# Patient Record
Sex: Male | Born: 1970 | Race: Black or African American | Hispanic: No | Smoking: Former smoker
Health system: Southern US, Community
[De-identification: ages and names within clinical notes are randomized; demographics above are authoritative.]

## PROBLEM LIST (undated history)

## (undated) DIAGNOSIS — F419 Anxiety disorder, unspecified: Secondary | ICD-10-CM

## (undated) DIAGNOSIS — E1165 Type 2 diabetes mellitus with hyperglycemia: Secondary | ICD-10-CM

## (undated) DIAGNOSIS — G5601 Carpal tunnel syndrome, right upper limb: Secondary | ICD-10-CM

## (undated) DIAGNOSIS — N529 Male erectile dysfunction, unspecified: Secondary | ICD-10-CM

## (undated) DIAGNOSIS — I519 Heart disease, unspecified: Secondary | ICD-10-CM

## (undated) DIAGNOSIS — E785 Hyperlipidemia, unspecified: Secondary | ICD-10-CM

## (undated) DIAGNOSIS — I10 Essential (primary) hypertension: Secondary | ICD-10-CM

## (undated) DIAGNOSIS — E039 Hypothyroidism, unspecified: Secondary | ICD-10-CM

## (undated) DIAGNOSIS — G4733 Obstructive sleep apnea (adult) (pediatric): Secondary | ICD-10-CM

## (undated) HISTORY — DX: Heart disease, unspecified: I51.9

## (undated) HISTORY — DX: Essential (primary) hypertension: I10

## (undated) HISTORY — DX: Type 2 diabetes mellitus with hyperglycemia: E11.65

## (undated) HISTORY — DX: Carpal tunnel syndrome, right upper limb: G56.01

## (undated) HISTORY — DX: Hypothyroidism, unspecified: E03.9

## (undated) HISTORY — DX: Obstructive sleep apnea (adult) (pediatric): G47.33

## (undated) HISTORY — DX: Anxiety disorder, unspecified: F41.9

## (undated) HISTORY — DX: Male erectile dysfunction, unspecified: N52.9

## (undated) HISTORY — DX: Hyperlipidemia, unspecified: E78.5

---

## 2013-11-06 ENCOUNTER — Emergency Department (HOSPITAL_COMMUNITY)
Admission: EM | Admit: 2013-11-06 | Discharge: 2013-11-06 | Disposition: A | Discharge status: Home / Self Care | Payer: Managed Care, Other (non HMO) | Attending: Emergency Medicine | Admitting: Emergency Medicine

## 2013-11-06 DIAGNOSIS — S46909A Unspecified injury of unspecified muscle, fascia and tendon at shoulder and upper arm level, unspecified arm, initial encounter: Secondary | ICD-10-CM | POA: Insufficient documentation

## 2013-11-06 DIAGNOSIS — Y990 Civilian activity done for income or pay: Secondary | ICD-10-CM | POA: Insufficient documentation

## 2013-11-06 DIAGNOSIS — S4980XA Other specified injuries of shoulder and upper arm, unspecified arm, initial encounter: Secondary | ICD-10-CM | POA: Insufficient documentation

## 2013-11-06 DIAGNOSIS — Y9389 Activity, other specified: Secondary | ICD-10-CM | POA: Insufficient documentation

## 2013-11-06 DIAGNOSIS — Y929 Unspecified place or not applicable: Secondary | ICD-10-CM | POA: Insufficient documentation

## 2013-11-06 DIAGNOSIS — M25511 Pain in right shoulder: Secondary | ICD-10-CM

## 2013-11-06 DIAGNOSIS — X503XXA Overexertion from repetitive movements, initial encounter: Secondary | ICD-10-CM | POA: Insufficient documentation

## 2013-11-06 MED ORDER — IBUPROFEN 800 MG PO TABS: 800.0000 mg | ORAL_TABLET | Freq: Three times a day (TID) | ORAL | Status: DC

## 2013-11-06 NOTE — ED Provider Notes (Signed)
CSN: [id number]     Arrival date & time 11/06/13  1520 History   First MD Initiated Contact with Patient 11/06/13 1530     Chief Complaint  Patient presents with  . Shoulder Pain   HPI Pt reports 3 days of ongoing right shoulder pain. Repetitive motion with right arm and shoulder at work. Felt a "pull" 3 days ago when lifting. No trauma. No swelling. No fevers. Denies rash or erythema of right shoulder. No CP or SOB. Pain is mild. Worse with abduction of right shoulder.    PMHX: none Family hx: non contributory SH: denies drug abuse    Review of Systems  All other systems reviewed and are negative.    Allergies  Review of patient's allergies indicates not on file.  Home Medications   Current Outpatient Rx  Name  Route  Sig  Dispense  Refill  . ibuprofen (ADVIL,MOTRIN) 800 MG tablet   Oral   Take 1 tablet (800 mg total) by mouth 3 (three) times daily.   21 tablet   0    BP 150/82  Pulse 87  Temp(Src) 98.7 F (37.1 C) (Oral)  Resp 16  SpO2 100% Physical Exam  Nursing note and vitals reviewed. Constitutional: He is oriented to person, place, and time. He appears well-developed and well-nourished.  HENT:  Head: Normocephalic.  Eyes: EOM are normal.  Neck: Normal range of motion.  Pulmonary/Chest: Effort normal.  Abdominal: He exhibits no distension.  Musculoskeletal: Normal range of motion.  Full ROM of right shoulder. Some impingement with abduction at approx 85 degrees. Normal right radial pulse. No tenderness over right AC joint  Neurological: He is alert and oriented to person, place, and time.  Psychiatric: He has a normal mood and affect.    ED Course  Procedures (including critical care time) Labs Review Labs Reviewed - No data to display Imaging Review No results found.  EKG Interpretation   None       MDM   1. Right shoulder pain    Ortho follow up. Normal pulses. Outpatient MRI may be required. No indication for plain film today given  lack of trauma    Lyanne Co, MD 11/06/13 [phone]

## 2013-11-06 NOTE — ED Notes (Signed)
Pt c/o R shoulder pain since Tuesday. Pt thinks that the repetitive motion at work is causing the pain. Pt c/o worsening pain with lifting shoulder. ROM intact, but painful.

## 2013-11-16 ENCOUNTER — Encounter (HOSPITAL_COMMUNITY): Payer: Self-pay | Admitting: Emergency Medicine

## 2013-11-16 DIAGNOSIS — Z5900 Homelessness unspecified: Secondary | ICD-10-CM | POA: Insufficient documentation

## 2013-11-16 DIAGNOSIS — I10 Essential (primary) hypertension: Secondary | ICD-10-CM | POA: Insufficient documentation

## 2013-11-16 DIAGNOSIS — Z91199 Patient's noncompliance with other medical treatment and regimen due to unspecified reason: Secondary | ICD-10-CM | POA: Insufficient documentation

## 2013-11-16 DIAGNOSIS — R350 Frequency of micturition: Secondary | ICD-10-CM | POA: Insufficient documentation

## 2013-11-16 DIAGNOSIS — Z9119 Patient's noncompliance with other medical treatment and regimen: Secondary | ICD-10-CM | POA: Insufficient documentation

## 2013-11-16 DIAGNOSIS — R5381 Other malaise: Secondary | ICD-10-CM | POA: Insufficient documentation

## 2013-11-16 DIAGNOSIS — Z79899 Other long term (current) drug therapy: Secondary | ICD-10-CM | POA: Insufficient documentation

## 2013-11-16 DIAGNOSIS — Z87891 Personal history of nicotine dependence: Secondary | ICD-10-CM | POA: Insufficient documentation

## 2013-11-16 DIAGNOSIS — Z794 Long term (current) use of insulin: Secondary | ICD-10-CM | POA: Insufficient documentation

## 2013-11-16 DIAGNOSIS — E119 Type 2 diabetes mellitus without complications: Secondary | ICD-10-CM | POA: Insufficient documentation

## 2013-11-16 DIAGNOSIS — R5383 Other fatigue: Secondary | ICD-10-CM | POA: Insufficient documentation

## 2013-11-16 DIAGNOSIS — R631 Polydipsia: Secondary | ICD-10-CM | POA: Insufficient documentation

## 2013-11-16 LAB — CBC
MCH: 30.3 pg (ref 26.0–34.0)
MCHC: 34.7 g/dL (ref 30.0–36.0)
MCV: 87.6 fL (ref 78.0–100.0)
Platelets: 270 10*3/uL (ref 150–400)
RDW: 12.9 % (ref 11.5–15.5)
WBC: 8.9 10*3/uL (ref 4.0–10.5)

## 2013-11-16 LAB — COMPREHENSIVE METABOLIC PANEL
AST: 32 U/L (ref 0–37)
Albumin: 3.8 g/dL (ref 3.5–5.2)
BUN: 29 mg/dL — ABNORMAL HIGH (ref 6–23)
CO2: 24 mEq/L (ref 19–32)
Calcium: 9 mg/dL (ref 8.4–10.5)
Creatinine, Ser: 1.31 mg/dL (ref 0.50–1.35)
GFR calc non Af Amer: 66 mL/min — ABNORMAL LOW (ref >90)
Glucose, Bld: 468 mg/dL — ABNORMAL HIGH (ref 70–99)
Potassium: 4.1 mEq/L (ref 3.5–5.1)
Total Protein: 7.6 g/dL (ref 6.0–8.3)

## 2013-11-16 LAB — GLUCOSE, CAPILLARY: Glucose-Capillary: 440 mg/dL — ABNORMAL HIGH (ref 70–99)

## 2013-11-16 NOTE — ED Notes (Signed)
Patient states he has had increased urination and not feeling right.  Dry mouth

## 2013-11-17 ENCOUNTER — Emergency Department (HOSPITAL_COMMUNITY)
Admission: EM | Admit: 2013-11-17 | Discharge: 2013-11-17 | Disposition: A | Discharge status: Home / Self Care | Payer: Managed Care, Other (non HMO) | Attending: Emergency Medicine | Admitting: Emergency Medicine

## 2013-11-17 DIAGNOSIS — E119 Type 2 diabetes mellitus without complications: Secondary | ICD-10-CM

## 2013-11-17 DIAGNOSIS — R739 Hyperglycemia, unspecified: Secondary | ICD-10-CM

## 2013-11-17 LAB — GLUCOSE, CAPILLARY
Glucose-Capillary: 326 mg/dL — ABNORMAL HIGH (ref 70–99)
Glucose-Capillary: 451 mg/dL — ABNORMAL HIGH (ref 70–99)

## 2013-11-17 MED ORDER — SODIUM CHLORIDE 0.9 % IV BOLUS (SEPSIS)
1000.0000 mL | Freq: Once | INTRAVENOUS | Status: AC
  Administered 2013-11-17: 1000 mL via INTRAVENOUS

## 2013-11-17 MED ORDER — INSULIN ASPART 100 UNIT/ML ~~LOC~~ SOLN
12.0000 [IU] | Freq: Once | SUBCUTANEOUS | Status: AC
  Administered 2013-11-17: 12 [IU] via INTRAVENOUS

## 2013-11-17 MED ORDER — INSULIN ASPART 100 UNIT/ML ~~LOC~~ SOLN
8.0000 [IU] | Freq: Once | SUBCUTANEOUS | Status: AC
  Administered 2013-11-17: 8 [IU] via SUBCUTANEOUS

## 2013-11-17 NOTE — ED Provider Notes (Signed)
CSN: [id number]     Arrival date & time 11/16/13  2055 History   First MD Initiated Contact with Patient 11/17/13 0147     Chief Complaint  Patient presents with  . Hyperglycemia   (Consider location/radiation/quality/duration/timing/severity/associated sxs/prior Treatment) HPI 42 yo male presents [facility] the ER from homeless shelter with complaint of generalized fatigue, increased thirst, urination over the last few days.  Pt has h/o diabetes, has not checked his sugar or taking medications for about 2 weeks.  Pt recently moved to [place] without his supplies.  He has plans to return to his hometown on [place] to go to his clinic for refills.  He denies chest pain, n/v/d, fever, chills. Past Medical History  Diagnosis Date  . Diabetes mellitus without complication   . Hypertension    History reviewed. No pertinent past surgical history. History reviewed. No pertinent family history. History  Substance Use Topics  . Smoking status: Former Smoker    Quit date: 10/26/2013  . Smokeless tobacco: Not on file  . Alcohol Use: Yes     Comment: ocassionally    Review of Systems  All other systems reviewed and are negative.    Allergies  Review of patient's allergies indicates no known allergies.  Home Medications   Current Outpatient Rx  Name  Route  Sig  Dispense  Refill  . insulin detemir (LEVEMIR) 100 UNIT/ML injection   Subcutaneous   Inject 30 Units into the skin 2 (two) times daily.         . insulin lispro (HUMALOG) 100 UNIT/ML injection   Subcutaneous   Inject 13 Units into the skin 2 (two) times daily.         . metFORMIN (GLUCOPHAGE) 1000 MG tablet   Oral   Take 1,000 mg by mouth 2 (two) times daily with a meal.          BP 134/61  Pulse 74  Temp(Src) 98.2 F (36.8 C) (Oral)  Resp 18  Ht 5\' 2"  (1.575 m)  Wt 201 lb (91.173 kg)  BMI 36.75 kg/m2  SpO2 96% Physical Exam  Nursing note and vitals reviewed. Constitutional: He is oriented to person,  place, and time. He appears well-developed and well-nourished. No distress.  HENT:  Head: Normocephalic and atraumatic.  Nose: Nose normal.  Dry mucous membranes  Eyes: Conjunctivae and EOM are normal. Pupils are equal, round, and reactive to light.  Neck: Normal range of motion. Neck supple. No JVD present. No tracheal deviation present. No thyromegaly present.  Cardiovascular: Normal rate, regular rhythm, normal heart sounds and intact distal pulses.  Exam reveals no gallop and no friction rub.   No murmur heard. Pulmonary/Chest: Effort normal and breath sounds normal. No stridor. No respiratory distress. He has no wheezes. He has no rales. He exhibits no tenderness.  Abdominal: Soft. Bowel sounds are normal. He exhibits no distension and no mass. There is no tenderness. There is no rebound and no guarding.  Musculoskeletal: Normal range of motion. He exhibits no edema and no tenderness.  Lymphadenopathy:    He has no cervical adenopathy.  Neurological: He is alert and oriented to person, place, and time. He exhibits normal muscle tone. Coordination normal.  Skin: Skin is warm and dry. No rash noted. No erythema. No pallor.  Psychiatric: He has a normal mood and affect. His behavior is normal. Judgment and thought content normal.    ED Course  Procedures (including critical care time) Labs Review Labs Reviewed  GLUCOSE, CAPILLARY -  Abnormal; Notable for the following:    Glucose-Capillary 440 (*)    All other components within normal limits  COMPREHENSIVE METABOLIC PANEL - Abnormal; Notable for the following:    Glucose, Bld 468 (*)    BUN 29 (*)    Total Bilirubin 0.1 (*)    GFR calc non Af Amer 66 (*)    GFR calc Af Amer 77 (*)    All other components within normal limits  GLUCOSE, CAPILLARY - Abnormal; Notable for the following:    Glucose-Capillary 451 (*)    All other components within normal limits  GLUCOSE, CAPILLARY - Abnormal; Notable for the following:     Glucose-Capillary 375 (*)    All other components within normal limits  GLUCOSE, CAPILLARY - Abnormal; Notable for the following:    Glucose-Capillary 326 (*)    All other components within normal limits  GLUCOSE, CAPILLARY - Abnormal; Notable for the following:    Glucose-Capillary 131 (*)    All other components within normal limits  CBC   Imaging Review No results found.  EKG Interpretation   None       MDM   1. Diabetes mellitus   2. Hyperglycemia    42 yo male noncompliant with his diabetes medications.  Hyperglycemia without ketosis here.  Glucose improved with iv fluids, insulin.  Pt has plan to get his medications tomorrow.    Olivia Mackie, MD 11/17/13 2009

## 2013-12-09 ENCOUNTER — Institutional Professional Consult (permissible substitution): Payer: Self-pay | Admitting: Pulmonary Disease

## 2014-01-07 ENCOUNTER — Institutional Professional Consult (permissible substitution): Payer: Managed Care, Other (non HMO) | Admitting: Pulmonary Disease

## 2014-02-03 ENCOUNTER — Institutional Professional Consult (permissible substitution): Payer: Managed Care, Other (non HMO) | Admitting: Pulmonary Disease

## 2016-01-17 DIAGNOSIS — R430 Anosmia: Secondary | ICD-10-CM | POA: Insufficient documentation

## 2016-07-11 DIAGNOSIS — J342 Deviated nasal septum: Secondary | ICD-10-CM | POA: Insufficient documentation

## 2017-10-14 DIAGNOSIS — F419 Anxiety disorder, unspecified: Secondary | ICD-10-CM | POA: Insufficient documentation

## 2017-10-14 DIAGNOSIS — E039 Hypothyroidism, unspecified: Secondary | ICD-10-CM | POA: Insufficient documentation

## 2017-10-14 DIAGNOSIS — E785 Hyperlipidemia, unspecified: Secondary | ICD-10-CM | POA: Insufficient documentation

## 2017-10-14 DIAGNOSIS — I10 Essential (primary) hypertension: Secondary | ICD-10-CM | POA: Insufficient documentation

## 2017-10-14 DIAGNOSIS — N529 Male erectile dysfunction, unspecified: Secondary | ICD-10-CM | POA: Insufficient documentation

## 2017-10-14 DIAGNOSIS — G5601 Carpal tunnel syndrome, right upper limb: Secondary | ICD-10-CM | POA: Insufficient documentation

## 2017-12-06 DIAGNOSIS — M25551 Pain in right hip: Secondary | ICD-10-CM | POA: Diagnosis not present

## 2017-12-06 DIAGNOSIS — E079 Disorder of thyroid, unspecified: Secondary | ICD-10-CM | POA: Diagnosis not present

## 2017-12-06 DIAGNOSIS — Y9239 Other specified sports and athletic area as the place of occurrence of the external cause: Secondary | ICD-10-CM | POA: Diagnosis not present

## 2017-12-06 DIAGNOSIS — Y93B3 Activity, free weights: Secondary | ICD-10-CM | POA: Diagnosis not present

## 2017-12-06 DIAGNOSIS — E119 Type 2 diabetes mellitus without complications: Secondary | ICD-10-CM | POA: Diagnosis not present

## 2017-12-06 DIAGNOSIS — X500XXA Overexertion from strenuous movement or load, initial encounter: Secondary | ICD-10-CM | POA: Diagnosis not present

## 2017-12-06 DIAGNOSIS — S39012A Strain of muscle, fascia and tendon of lower back, initial encounter: Secondary | ICD-10-CM | POA: Diagnosis not present

## 2017-12-06 DIAGNOSIS — I10 Essential (primary) hypertension: Secondary | ICD-10-CM | POA: Diagnosis not present

## 2017-12-06 DIAGNOSIS — Z87891 Personal history of nicotine dependence: Secondary | ICD-10-CM | POA: Diagnosis not present

## 2017-12-06 DIAGNOSIS — Z794 Long term (current) use of insulin: Secondary | ICD-10-CM | POA: Diagnosis not present

## 2017-12-06 DIAGNOSIS — M545 Low back pain: Secondary | ICD-10-CM | POA: Diagnosis not present

## 2017-12-06 DIAGNOSIS — Z79899 Other long term (current) drug therapy: Secondary | ICD-10-CM | POA: Diagnosis not present

## 2017-12-13 DIAGNOSIS — Z87891 Personal history of nicotine dependence: Secondary | ICD-10-CM | POA: Diagnosis not present

## 2017-12-13 DIAGNOSIS — M5441 Lumbago with sciatica, right side: Secondary | ICD-10-CM | POA: Diagnosis not present

## 2017-12-13 DIAGNOSIS — I10 Essential (primary) hypertension: Secondary | ICD-10-CM | POA: Diagnosis not present

## 2017-12-13 DIAGNOSIS — M549 Dorsalgia, unspecified: Secondary | ICD-10-CM | POA: Diagnosis not present

## 2017-12-13 DIAGNOSIS — R030 Elevated blood-pressure reading, without diagnosis of hypertension: Secondary | ICD-10-CM | POA: Diagnosis not present

## 2017-12-13 DIAGNOSIS — Z7984 Long term (current) use of oral hypoglycemic drugs: Secondary | ICD-10-CM | POA: Diagnosis not present

## 2017-12-13 DIAGNOSIS — M5136 Other intervertebral disc degeneration, lumbar region: Secondary | ICD-10-CM | POA: Diagnosis not present

## 2017-12-13 DIAGNOSIS — Z794 Long term (current) use of insulin: Secondary | ICD-10-CM | POA: Diagnosis not present

## 2017-12-13 DIAGNOSIS — Z79899 Other long term (current) drug therapy: Secondary | ICD-10-CM | POA: Diagnosis not present

## 2017-12-13 DIAGNOSIS — E118 Type 2 diabetes mellitus with unspecified complications: Secondary | ICD-10-CM | POA: Diagnosis not present

## 2017-12-13 DIAGNOSIS — M47896 Other spondylosis, lumbar region: Secondary | ICD-10-CM | POA: Diagnosis not present

## 2017-12-13 DIAGNOSIS — E079 Disorder of thyroid, unspecified: Secondary | ICD-10-CM | POA: Diagnosis not present

## 2018-01-23 DIAGNOSIS — E1165 Type 2 diabetes mellitus with hyperglycemia: Secondary | ICD-10-CM | POA: Diagnosis not present

## 2018-01-23 DIAGNOSIS — E039 Hypothyroidism, unspecified: Secondary | ICD-10-CM | POA: Diagnosis not present

## 2018-01-23 DIAGNOSIS — I519 Heart disease, unspecified: Secondary | ICD-10-CM | POA: Diagnosis not present

## 2018-01-23 DIAGNOSIS — I10 Essential (primary) hypertension: Secondary | ICD-10-CM | POA: Diagnosis not present

## 2018-01-23 LAB — HM HEPATITIS C SCREENING LAB: HM Hepatitis Screen: NEGATIVE

## 2018-02-06 DIAGNOSIS — M545 Low back pain: Secondary | ICD-10-CM | POA: Diagnosis not present

## 2018-02-06 DIAGNOSIS — M79604 Pain in right leg: Secondary | ICD-10-CM | POA: Diagnosis not present

## 2018-02-07 DIAGNOSIS — J0121 Acute recurrent ethmoidal sinusitis: Secondary | ICD-10-CM | POA: Diagnosis not present

## 2018-02-07 DIAGNOSIS — J339 Nasal polyp, unspecified: Secondary | ICD-10-CM | POA: Diagnosis not present

## 2018-02-07 DIAGNOSIS — R220 Localized swelling, mass and lump, head: Secondary | ICD-10-CM | POA: Diagnosis not present

## 2018-02-07 DIAGNOSIS — R430 Anosmia: Secondary | ICD-10-CM | POA: Diagnosis not present

## 2018-02-13 DIAGNOSIS — J3489 Other specified disorders of nose and nasal sinuses: Secondary | ICD-10-CM | POA: Diagnosis not present

## 2018-02-13 DIAGNOSIS — J339 Nasal polyp, unspecified: Secondary | ICD-10-CM | POA: Diagnosis not present

## 2018-02-13 DIAGNOSIS — J320 Chronic maxillary sinusitis: Secondary | ICD-10-CM | POA: Diagnosis not present

## 2018-02-13 DIAGNOSIS — J0121 Acute recurrent ethmoidal sinusitis: Secondary | ICD-10-CM | POA: Diagnosis not present

## 2018-02-13 DIAGNOSIS — R430 Anosmia: Secondary | ICD-10-CM | POA: Diagnosis not present

## 2018-02-14 DIAGNOSIS — Z7984 Long term (current) use of oral hypoglycemic drugs: Secondary | ICD-10-CM | POA: Diagnosis not present

## 2018-02-14 DIAGNOSIS — E079 Disorder of thyroid, unspecified: Secondary | ICD-10-CM | POA: Diagnosis not present

## 2018-02-14 DIAGNOSIS — R05 Cough: Secondary | ICD-10-CM | POA: Diagnosis not present

## 2018-02-14 DIAGNOSIS — I10 Essential (primary) hypertension: Secondary | ICD-10-CM | POA: Diagnosis not present

## 2018-02-14 DIAGNOSIS — Z79899 Other long term (current) drug therapy: Secondary | ICD-10-CM | POA: Diagnosis not present

## 2018-02-14 DIAGNOSIS — Z794 Long term (current) use of insulin: Secondary | ICD-10-CM | POA: Diagnosis not present

## 2018-02-14 DIAGNOSIS — E119 Type 2 diabetes mellitus without complications: Secondary | ICD-10-CM | POA: Diagnosis not present

## 2018-02-14 DIAGNOSIS — J101 Influenza due to other identified influenza virus with other respiratory manifestations: Secondary | ICD-10-CM | POA: Diagnosis not present

## 2018-02-14 DIAGNOSIS — Z87891 Personal history of nicotine dependence: Secondary | ICD-10-CM | POA: Diagnosis not present

## 2018-02-20 DIAGNOSIS — M5431 Sciatica, right side: Secondary | ICD-10-CM | POA: Diagnosis not present

## 2018-02-20 DIAGNOSIS — Z0000 Encounter for general adult medical examination without abnormal findings: Secondary | ICD-10-CM | POA: Diagnosis not present

## 2018-02-20 DIAGNOSIS — Z6837 Body mass index (BMI) 37.0-37.9, adult: Secondary | ICD-10-CM | POA: Diagnosis not present

## 2018-11-11 DIAGNOSIS — G4733 Obstructive sleep apnea (adult) (pediatric): Secondary | ICD-10-CM | POA: Diagnosis not present

## 2018-11-11 DIAGNOSIS — E1165 Type 2 diabetes mellitus with hyperglycemia: Secondary | ICD-10-CM | POA: Diagnosis not present

## 2018-11-11 DIAGNOSIS — Z794 Long term (current) use of insulin: Secondary | ICD-10-CM | POA: Diagnosis not present

## 2018-11-11 DIAGNOSIS — Z202 Contact with and (suspected) exposure to infections with a predominantly sexual mode of transmission: Secondary | ICD-10-CM | POA: Diagnosis not present

## 2018-11-11 DIAGNOSIS — I10 Essential (primary) hypertension: Secondary | ICD-10-CM | POA: Diagnosis not present

## 2018-11-11 LAB — LIPID PANEL
Cholesterol: 257 — AB (ref 0–200)
HDL: 44 (ref 35–70)
LDL Cholesterol: 177
TRIGLYCERIDES: 181 — AB (ref 40–160)

## 2018-11-11 LAB — HM HIV SCREENING LAB: HM HIV SCREENING: NEGATIVE

## 2018-11-11 LAB — TSH: TSH: 4.65 (ref <=5.90)

## 2018-11-11 LAB — MICROALBUMIN, URINE: Microalb, Ur: 30

## 2018-11-11 LAB — HEMOGLOBIN A1C: Hemoglobin A1C: 10.4

## 2018-11-19 ENCOUNTER — Other Ambulatory Visit (HOSPITAL_COMMUNITY): Payer: Self-pay | Admitting: Family Medicine

## 2018-11-19 DIAGNOSIS — I10 Essential (primary) hypertension: Secondary | ICD-10-CM

## 2018-12-02 ENCOUNTER — Ambulatory Visit (HOSPITAL_COMMUNITY): Payer: BLUE CROSS/BLUE SHIELD

## 2018-12-11 ENCOUNTER — Ambulatory Visit (HOSPITAL_COMMUNITY): Admission: RE | Admit: 2018-12-11 | Payer: BLUE CROSS/BLUE SHIELD | Source: Ambulatory Visit

## 2019-01-01 DIAGNOSIS — E119 Type 2 diabetes mellitus without complications: Secondary | ICD-10-CM | POA: Diagnosis not present

## 2019-01-01 DIAGNOSIS — H40033 Anatomical narrow angle, bilateral: Secondary | ICD-10-CM | POA: Diagnosis not present

## 2019-02-02 ENCOUNTER — Encounter: Payer: Self-pay | Admitting: Family Medicine

## 2019-02-04 ENCOUNTER — Ambulatory Visit: Payer: BLUE CROSS/BLUE SHIELD | Admitting: Family Medicine

## 2019-02-04 DIAGNOSIS — G4710 Hypersomnia, unspecified: Secondary | ICD-10-CM | POA: Diagnosis not present

## 2019-02-04 DIAGNOSIS — R0683 Snoring: Secondary | ICD-10-CM | POA: Diagnosis not present

## 2019-02-04 DIAGNOSIS — R29818 Other symptoms and signs involving the nervous system: Secondary | ICD-10-CM | POA: Diagnosis not present

## 2019-02-04 NOTE — Progress Notes (Deleted)
Danny Macdonald, is a 47 y.o. male  ACZ:[id number]  FUX:[id number]  DOB - 11/16/1971  CC: No chief complaint on file.      HPI: Danny Macdonald is a 48 y.o. male is here today to establish care and chronic disease management. Current active problems include, uncontrolled type 2 diabetes, hypertension , hypothyroidism, erectile dysfunction   Current medications: Current Outpatient Medications:  .  amLODipine (NORVASC) 10 MG tablet, Take 1 tablet by mouth at bedtime., Disp: , Rfl:  .  aspirin EC 81 MG tablet, Take 1 tablet by mouth daily., Disp: , Rfl:  .  atorvastatin (LIPITOR) 10 MG tablet, Take 1 tablet by mouth at bedtime., Disp: , Rfl:  .  canagliflozin (INVOKANA) 100 MG TABS tablet, Take 1 tablet by mouth daily., Disp: , Rfl:  .  citalopram (CELEXA) 20 MG tablet, Take 1 tablet by mouth daily., Disp: , Rfl:  .  insulin aspart (NOVOLOG FLEXPEN) 100 UNIT/ML FlexPen, Inject 8 Units into the skin 3 (three) times daily before meals., Disp: , Rfl:  .  Insulin Detemir (LEVEMIR FLEXTOUCH) 100 UNIT/ML Pen, Inject 50 Units into the skin 2 (two) times daily., Disp: , Rfl:  .  levothyroxine (SYNTHROID, LEVOTHROID) 100 MCG tablet, Take 1 tablet by mouth every morning., Disp: , Rfl:  .  losartan-hydrochlorothiazide (HYZAAR) 100-12.5 MG tablet, Take 1 tablet by mouth daily., Disp: , Rfl:  .  metFORMIN (GLUCOPHAGE) 500 MG tablet, Take 2 tablets by mouth daily., Disp: , Rfl:  .  sildenafil (VIAGRA) 100 MG tablet, Take 1 tablet by mouth as needed for erectile dysfunction., Disp: , Rfl:  .  topiramate (TOPAMAX) 25 MG tablet, Take 1 tablet by mouth daily., Disp: , Rfl:    Pertinent family medical history: family history includes Arthritis in his mother; Diabetes in his child, maternal grandfather, maternal grandmother, and mother; Hypertension in his brother, father, maternal grandfather, maternal grandmother, mother, paternal grandfather, paternal grandmother, and sister.   No Known Allergies  Social History    Socioeconomic History  . Marital status: Significant Other    Spouse name: Not on file  . Number of children: Not on file  . Years of education: Not on file  . Highest education level: Not on file  Occupational History  . Not on file  Social Needs  . Financial resource strain: Not on file  . Food insecurity:    Worry: Not on file    Inability: Not on file  . Transportation needs:    Medical: Not on file    Non-medical: Not on file  Tobacco Use  . Smoking status: Former Smoker    Last attempt to quit: 10/26/2013    Years since quitting: 5.2  . Smokeless tobacco: Never Used  Substance and Sexual Activity  . Alcohol use: Yes    Comment: ocassionally  . Drug use: No  . Sexual activity: Not Currently  Lifestyle  . Physical activity:    Days per week: Not on file    Minutes per session: Not on file  . Stress: Not on file  Relationships  . Social connections:    Talks on phone: Not on file    Gets together: Not on file    Attends religious service: Not on file    Active member of club or organization: Not on file    Attends meetings of clubs or organizations: Not on file    Relationship status: Not on file  . Intimate partner violence:    Fear of current or ex  partner: Not on file    Emotionally abused: Not on file    Physically abused: Not on file    Forced sexual activity: Not on file  Other Topics Concern  . Not on file  Social History Narrative  . Not on file    Review of Systems: Constitutional: Negative for fever, chills, diaphoresis, activity change, appetite change and fatigue. HENT: Negative for ear pain, nosebleeds, congestion, facial swelling, rhinorrhea, neck pain, neck stiffness and ear discharge.  Eyes: Negative for pain, discharge, redness, itching and visual disturbance. Respiratory: Negative for cough, choking, chest tightness, shortness of breath, wheezing and stridor.  Cardiovascular: Negative for chest pain, palpitations and leg  swelling. Gastrointestinal: Negative for abdominal distention. Genitourinary: Negative for dysuria, urgency, frequency, hematuria, flank pain, decreased urine volume, difficulty urinating. Musculoskeletal: Negative for back pain, joint swelling, arthralgia and gait problem. Neurological: Negative for dizziness, tremors, seizures, syncope, facial asymmetry, speech difficulty, weakness, light-headedness, numbness and headaches.  Hematological: Negative for adenopathy. Does not bruise/bleed easily. Psychiatric/Behavioral: Negative for hallucinations, behavioral problems, confusion, dysphoric mood, decreased concentration and agitation.    Objective:  There were no vitals filed for this visit.  BP Readings from Last 3 Encounters:  11/17/13 134/61  11/06/13 150/82    There were no vitals filed for this visit.    Physical Exam: Constitutional: Patient appears well-developed and well-nourished. No distress. HENT: Normocephalic, atraumatic, External right and left ear normal. Oropharynx is clear and moist.  Eyes: Conjunctivae and EOM are normal. PERRLA, no scleral icterus. Neck: Normal ROM. Neck supple. No JVD. No tracheal deviation. No thyromegaly. CVS: RRR, S1/S2 +, no murmurs, no gallops, no carotid bruit.  Pulmonary: Effort and breath sounds normal, no stridor, rhonchi, wheezes, rales.  Abdominal: Soft. BS +, no distension, tenderness, rebound or guarding.  Musculoskeletal: Normal range of motion. No edema and no tenderness.  Neuro: Alert. Normal muscle tone coordination. Normal gait. BUE and BLE strength 5/5. Bilateral hand grips symmetrical. No cranial nerve deficit. Skin: Skin is warm and dry. No rash noted. Not diaphoretic. No erythema. No pallor. Psychiatric: Normal mood and affect. Behavior, judgment, thought content normal.  Lab Results (prior encounters)  Lab Results  Component Value Date   WBC 8.9 11/16/2013   HGB 13.9 11/16/2013   HCT 40.1 11/16/2013   MCV 87.6 11/16/2013    PLT 270 11/16/2013   Lab Results  Component Value Date   CREATININE 1.31 11/16/2013   BUN 29 (H) 11/16/2013   NA 136 11/16/2013   K 4.1 11/16/2013   CL 97 11/16/2013   CO2 24 11/16/2013    Lab Results  Component Value Date   HGBA1C 10.4 11/11/2018       Component Value Date/Time   CHOL 257 (A) 11/11/2018   TRIG 181 (A) 11/11/2018   HDL 44 11/11/2018   LDLCALC 177 11/11/2018        Assessment and plan:  There are no diagnoses linked to this encounter.  No follow-ups on file.   The patient was given clear instructions to go to ER or return to medical center if symptoms don't improve, worsen or new problems develop. The patient verbalized understanding. The patient was advised  to call and obtain lab results if they haven't heard anything from out office within 7-10 business days.  Joaquin Courts, FNP Primary Care at [facility] [address], [place] [address] [phone]: [phone]    This note has been created with Dragon speech recognition software and Paediatric nurse. Any transcriptional errors  are unintentional.

## 2019-02-25 ENCOUNTER — Ambulatory Visit: Payer: BLUE CROSS/BLUE SHIELD | Admitting: Family Medicine

## 2019-04-08 ENCOUNTER — Other Ambulatory Visit: Payer: Self-pay

## 2019-04-08 ENCOUNTER — Encounter: Payer: Self-pay | Admitting: Family Medicine

## 2019-04-08 ENCOUNTER — Ambulatory Visit (INDEPENDENT_AMBULATORY_CARE_PROVIDER_SITE_OTHER): Payer: BLUE CROSS/BLUE SHIELD | Admitting: Family Medicine

## 2019-04-08 DIAGNOSIS — G4733 Obstructive sleep apnea (adult) (pediatric): Secondary | ICD-10-CM | POA: Diagnosis not present

## 2019-04-08 DIAGNOSIS — E785 Hyperlipidemia, unspecified: Secondary | ICD-10-CM

## 2019-04-08 DIAGNOSIS — Z9989 Dependence on other enabling machines and devices: Secondary | ICD-10-CM

## 2019-04-08 DIAGNOSIS — Z125 Encounter for screening for malignant neoplasm of prostate: Secondary | ICD-10-CM

## 2019-04-08 DIAGNOSIS — I10 Essential (primary) hypertension: Secondary | ICD-10-CM | POA: Diagnosis not present

## 2019-04-08 DIAGNOSIS — E1159 Type 2 diabetes mellitus with other circulatory complications: Secondary | ICD-10-CM | POA: Diagnosis not present

## 2019-04-08 DIAGNOSIS — Z794 Long term (current) use of insulin: Secondary | ICD-10-CM

## 2019-04-08 NOTE — Progress Notes (Signed)
Virtual Visit via Telephone Note  I connected with Viona Gilmoreoland Verba on 04/08/19 at  8:50 AM EDT by telephone and verified that I am speaking with the correct person using two identifiers.  Location: Patient: Located at primary care office Provider: Located at primary care office     I discussed the limitations, risks, security and privacy concerns of performing an evaluation and management service by telephone and the availability of in person appointments. I also discussed with the patient that there may be a patient responsible charge related to this service. The patient expressed understanding and agreed to proceed.   History of Present Illness:  Diabetes, Type 2, uncontrolled Endorses poor readings. His blood sugars are elevated greater 200, consistently. Last A1C 10.4 from December of 2019.  His current diabetes regimen consist of Levemir 50 units twice daily, NovoLog 8 units 3 times daily with food, Invokana, and metformin. He is currently taking all medications as he has had refills on medicines from his prior provider.  Also endorses recent weight gain.  Endorses no routine physical activity.  He is a non-smoker however is a former smoker and quit approximately 2 years ago.  Obstructed Sleep Apnea History of OSA. Home sleep test ordered back in March. He stopped two years ago as he had lost weight. He is now back on CPAP. He called the company that completed his initial sleep study and obtained his prior settings and resumed use of CPAP over the last 2 weeks. Works 3rd shift and naps during the day and doesn't really lay down. Endorses poor quality of sleep. Doesn't feel CPAP is benefiting him now as it did previously. Endorses weight gain. Wonders if settings have changed for machine. Previous sleep study at least 4 or more years ago.   Hypertension No home monitoring of blood pressure. Current prescribed Norvasc and losartan/HCTZ.  He is a former smoker. Reports a history of atypical  chest pain which he presented to the ER in the past and symptoms were attributed to GERD. Denies SOB or lower extremity swelling. Admits to recent poor dietary habits including not limiting foods according to sodium content.    Assessment and Plan: 1. OSA on CPAP - Split night study; Future  2. Essential hypertension Return in 1 week for blood pressure check.  Will adjust medications if warranted.  Take all medications prior to office visit.   Checking TSH and CMP  3. Screening PSA (prostate specific antigen), routine health screening in African-American male over 47 years of age - PSA; Future  4. Type 2 diabetes mellitus with other circulatory complication, with long-term current use of insulin (HCC) History of very poor control most recent A1c was 10.4 back in December.  No medication adjustments made today will have patient follow-up in office in 1 week for a repeat A1c, CMP, CBC.  Once labs have resulted we will make appropriate adjustments to medication. Checking: - Comprehensive metabolic panel; Future - Hemoglobin A1c; Future - CBC with Differential; Future  5. Hyperlipidemia, unspecified hyperlipidemia type - Lipid panel; Future   Follow Up Instructions: 1 week lab and blood pressure check. Will make follow-up appointment based on lab results and medication adjustments.   I discussed the assessment and treatment plan with the patient. The patient was provided an opportunity to ask questions and all were answered. The patient agreed with the plan and demonstrated an understanding of the instructions.   The patient was advised to call back or seek an in-person evaluation if the symptoms  worsen or if the condition fails to improve as anticipated.  I provided 30 minutes of non-face-to-face time during this encounter.   Joaquin Courts, FNP

## 2019-04-08 NOTE — Progress Notes (Signed)
Called patient to initiate their telephone visit with provider Joaquin Courts, FNP-C. Verified date of birth. States that fasting blood sugars have been in the 220-250 range. KWalker, CMA.

## 2019-04-15 ENCOUNTER — Other Ambulatory Visit: Payer: Self-pay

## 2019-04-15 ENCOUNTER — Ambulatory Visit (INDEPENDENT_AMBULATORY_CARE_PROVIDER_SITE_OTHER): Payer: BLUE CROSS/BLUE SHIELD

## 2019-04-15 ENCOUNTER — Other Ambulatory Visit: Payer: BLUE CROSS/BLUE SHIELD

## 2019-04-15 VITALS — BP 146/91 | HR 72 | Temp 98.3°F | Resp 17 | Ht 60.0 in | Wt 218.4 lb

## 2019-04-15 DIAGNOSIS — Z794 Long term (current) use of insulin: Secondary | ICD-10-CM | POA: Diagnosis not present

## 2019-04-15 DIAGNOSIS — I10 Essential (primary) hypertension: Secondary | ICD-10-CM

## 2019-04-15 DIAGNOSIS — E1159 Type 2 diabetes mellitus with other circulatory complications: Secondary | ICD-10-CM | POA: Diagnosis not present

## 2019-04-15 DIAGNOSIS — Z125 Encounter for screening for malignant neoplasm of prostate: Secondary | ICD-10-CM

## 2019-04-15 DIAGNOSIS — E785 Hyperlipidemia, unspecified: Secondary | ICD-10-CM | POA: Diagnosis not present

## 2019-04-15 MED ORDER — METOPROLOL SUCCINATE ER 25 MG PO TB24: 25.0000 mg | ORAL_TABLET | Freq: Every day | ORAL | 3 refills | Status: DC

## 2019-04-15 NOTE — Patient Instructions (Signed)
Added new medication to your blood pressure regimen. Start metoprolol 25 mg once daily in the morning.     Hypertension Hypertension, commonly called high blood pressure, is when the force of blood pumping through the arteries is too strong. The arteries are the blood vessels that carry blood from the heart throughout the body. Hypertension forces the heart to work harder to pump blood and may cause arteries to become narrow or stiff. Having untreated or uncontrolled hypertension can cause heart attacks, strokes, kidney disease, and other problems. A blood pressure reading consists of a higher number over a lower number. Ideally, your blood pressure should be below 120/80. The first ("top") number is called the systolic pressure. It is a measure of the pressure in your arteries as your heart beats. The second ("bottom") number is called the diastolic pressure. It is a measure of the pressure in your arteries as the heart relaxes. What are the causes? The cause of this condition is not known. What increases the risk? Some risk factors for high blood pressure are under your control. Others are not. Factors you can change  Smoking.  Having type 2 diabetes mellitus, high cholesterol, or both.  Not getting enough exercise or physical activity.  Being overweight.  Having too much fat, sugar, calories, or salt (sodium) in your diet.  Drinking too much alcohol. Factors that are difficult or impossible to change  Having chronic kidney disease.  Having a family history of high blood pressure.  Age. Risk increases with age.  Race. You may be at higher risk if you are African-American.  Gender. Men are at higher risk than women before age 55. After age 29, women are at higher risk than men.  Having obstructive sleep apnea.  Stress. What are the signs or symptoms? Extremely high blood pressure (hypertensive crisis) may cause:  Headache.  Anxiety.  Shortness of breath.  Nosebleed.   Nausea and vomiting.  Severe chest pain.  Jerky movements you cannot control (seizures). How is this diagnosed? This condition is diagnosed by measuring your blood pressure while you are seated, with your arm resting on a surface. The cuff of the blood pressure monitor will be placed directly against the skin of your upper arm at the level of your heart. It should be measured at least twice using the same arm. Certain conditions can cause a difference in blood pressure between your right and left arms. Certain factors can cause blood pressure readings to be lower or higher than normal (elevated) for a short period of time:  When your blood pressure is higher when you are in a health care provider's office than when you are at home, this is called white coat hypertension. Most people with this condition do not need medicines.  When your blood pressure is higher at home than when you are in a health care provider's office, this is called masked hypertension. Most people with this condition may need medicines to control blood pressure. If you have a high blood pressure reading during one visit or you have normal blood pressure with other risk factors:  You may be asked to return on a different day to have your blood pressure checked again.  You may be asked to monitor your blood pressure at home for 1 week or longer. If you are diagnosed with hypertension, you may have other blood or imaging tests to help your health care provider understand your overall risk for other conditions. How is this treated? This condition is treated  by making healthy lifestyle changes, such as eating healthy foods, exercising more, and reducing your alcohol intake. Your health care provider may prescribe medicine if lifestyle changes are not enough to get your blood pressure under control, and if:  Your systolic blood pressure is above 130.  Your diastolic blood pressure is above 80. Your personal target blood  pressure may vary depending on your medical conditions, your age, and other factors. Follow these instructions at home: Eating and drinking   Eat a diet that is high in fiber and potassium, and low in sodium, added sugar, and fat. An example eating plan is called the DASH (Dietary Approaches to Stop Hypertension) diet. To eat this way: ? Eat plenty of fresh fruits and vegetables. Try to fill half of your plate at each meal with fruits and vegetables. ? Eat whole grains, such as whole wheat pasta, brown rice, or whole grain bread. Fill about one quarter of your plate with whole grains. ? Eat or drink low-fat dairy products, such as skim milk or low-fat yogurt. ? Avoid fatty cuts of meat, processed or cured meats, and poultry with skin. Fill about one quarter of your plate with lean proteins, such as fish, chicken without skin, beans, eggs, and tofu. ? Avoid premade and processed foods. These tend to be higher in sodium, added sugar, and fat.  Reduce your daily sodium intake. Most people with hypertension should eat less than 1,500 mg of sodium a day.  Limit alcohol intake to no more than 1 drink a day for nonpregnant women and 2 drinks a day for men. One drink equals 12 oz of beer, 5 oz of wine, or 1 oz of hard liquor. Lifestyle   Work with your health care provider to maintain a healthy body weight or to lose weight. Ask what an ideal weight is for you.  Get at least 30 minutes of exercise that causes your heart to beat faster (aerobic exercise) most days of the week. Activities may include walking, swimming, or biking.  Include exercise to strengthen your muscles (resistance exercise), such as pilates or lifting weights, as part of your weekly exercise routine. Try to do these types of exercises for 30 minutes at least 3 days a week.  Do not use any products that contain nicotine or tobacco, such as cigarettes and e-cigarettes. If you need help quitting, ask your health care provider.   Monitor your blood pressure at home as told by your health care provider.  Keep all follow-up visits as told by your health care provider. This is important. Medicines  Take over-the-counter and prescription medicines only as told by your health care provider. Follow directions carefully. Blood pressure medicines must be taken as prescribed.  Do not skip doses of blood pressure medicine. Doing this puts you at risk for problems and can make the medicine less effective.  Ask your health care provider about side effects or reactions to medicines that you should watch for. Contact a health care provider if:  You think you are having a reaction to a medicine you are taking.  You have headaches that keep coming back (recurring).  You feel dizzy.  You have swelling in your ankles.  You have trouble with your vision. Get help right away if:  You develop a severe headache or confusion.  You have unusual weakness or numbness.  You feel faint.  You have severe pain in your chest or abdomen.  You vomit repeatedly.  You have trouble breathing. Summary  Hypertension is when the force of blood pumping through your arteries is too strong. If this condition is not controlled, it may put you at risk for serious complications.  Your personal target blood pressure may vary depending on your medical conditions, your age, and other factors. For most people, a normal blood pressure is less than 120/80.  Hypertension is treated with lifestyle changes, medicines, or a combination of both. Lifestyle changes include weight loss, eating a healthy, low-sodium diet, exercising more, and limiting alcohol. This information is not intended to replace advice given to you by your health care provider. Make sure you discuss any questions you have with your health care provider. Document Released: 11/19/2005 Document Revised: 10/17/2016 Document Reviewed: 10/17/2016 Elsevier Interactive Patient Education   2019 [employer].

## 2019-04-15 NOTE — Addendum Note (Signed)
Addended by: Bing Neighbors on: 04/15/2019 09:08 AM   Modules accepted: Level of Service

## 2019-04-15 NOTE — Progress Notes (Signed)
Blood pressure remains not at goal. Starting patient on Metoprolol 25 ER once daily in addition to other antihypertension medications. He will return for a BP check in one month.  Joaquin Courts, FNP

## 2019-04-15 NOTE — Progress Notes (Signed)
Patient here for fasting labs & full vitals. Patient states that he has taken all morning medications today. After letting patient sit vitals were as listed below. Spoke with provider & she states that she will add Metoprolol 25 mg once a day to medications. Patient states that he has a BP cuff at home. Per Ms. Harris patient can have a televisit with nurse to follow up on BP in 2 weeks. KWalker, CMA.  Vitals:   04/15/19 0849  BP: (!) 146/91  Pulse: 72  Resp: 17  Temp: 98.3 F (36.8 C)  SpO2: 96%

## 2019-04-16 LAB — CBC WITH DIFFERENTIAL/PLATELET
Basophils Absolute: 0 10*3/uL (ref 0.0–0.2)
Basos: 0 %
EOS (ABSOLUTE): 0.1 10*3/uL (ref 0.0–0.4)
Eos: 1 %
Hematocrit: 42.8 % (ref 37.5–51.0)
Hemoglobin: 14.7 g/dL (ref 13.0–17.7)
Immature Grans (Abs): 0 10*3/uL (ref 0.0–0.1)
Immature Granulocytes: 0 %
Lymphocytes Absolute: 2.4 10*3/uL (ref 0.7–3.1)
Lymphs: 32 %
MCH: 30.2 pg (ref 26.6–33.0)
MCHC: 34.3 g/dL (ref 31.5–35.7)
MCV: 88 fL (ref 79–97)
Monocytes Absolute: 0.6 10*3/uL (ref 0.1–0.9)
Monocytes: 8 %
Neutrophils Absolute: 4.3 10*3/uL (ref 1.4–7.0)
Neutrophils: 59 %
Platelets: 254 10*3/uL (ref 150–450)
RBC: 4.87 x10E6/uL (ref 4.14–5.80)
RDW: 13.6 % (ref 11.6–15.4)
WBC: 7.4 10*3/uL (ref 3.4–10.8)

## 2019-04-16 LAB — LIPID PANEL
Chol/HDL Ratio: 5.7 ratio — ABNORMAL HIGH (ref 0.0–5.0)
Cholesterol, Total: 238 mg/dL — ABNORMAL HIGH (ref 100–199)
HDL: 42 mg/dL (ref >39)
LDL Calculated: 172 mg/dL — ABNORMAL HIGH (ref 0–99)
Triglycerides: 122 mg/dL (ref 0–149)
VLDL Cholesterol Cal: 24 mg/dL (ref 5–40)

## 2019-04-16 LAB — COMPREHENSIVE METABOLIC PANEL
ALT: 38 IU/L (ref 0–44)
AST: 36 IU/L (ref 0–40)
Albumin/Globulin Ratio: 1.6 (ref 1.2–2.2)
Albumin: 4.6 g/dL (ref 4.0–5.0)
Alkaline Phosphatase: 80 IU/L (ref 39–117)
BUN/Creatinine Ratio: 16 (ref 9–20)
BUN: 20 mg/dL (ref 6–24)
Bilirubin Total: 0.3 mg/dL (ref 0.0–1.2)
CO2: 21 mmol/L (ref 20–29)
Calcium: 9.3 mg/dL (ref 8.7–10.2)
Chloride: 102 mmol/L (ref 96–106)
Creatinine, Ser: 1.24 mg/dL (ref 0.76–1.27)
GFR calc Af Amer: 80 mL/min/{1.73_m2} (ref >59)
GFR calc non Af Amer: 69 mL/min/{1.73_m2} (ref >59)
Globulin, Total: 2.9 g/dL (ref 1.5–4.5)
Glucose: 188 mg/dL — ABNORMAL HIGH (ref 65–99)
Potassium: 3.5 mmol/L (ref 3.5–5.2)
Sodium: 141 mmol/L (ref 134–144)
Total Protein: 7.5 g/dL (ref 6.0–8.5)

## 2019-04-16 LAB — PSA: Prostate Specific Ag, Serum: 3.4 ng/mL (ref 0.0–4.0)

## 2019-04-16 LAB — HEMOGLOBIN A1C
Est. average glucose Bld gHb Est-mCnc: 223 mg/dL
Hgb A1c MFr Bld: 9.4 % — ABNORMAL HIGH (ref 4.8–5.6)

## 2019-04-16 LAB — TSH: TSH: 2.77 u[IU]/mL (ref 0.450–4.500)

## 2019-04-20 ENCOUNTER — Telehealth: Payer: Self-pay | Admitting: Family Medicine

## 2019-04-21 ENCOUNTER — Other Ambulatory Visit: Payer: Self-pay | Admitting: Family Medicine

## 2019-04-21 NOTE — Addendum Note (Signed)
Addended by: Bing Neighbors on: 04/21/2019 07:57 AM   Modules accepted: Orders

## 2019-04-21 NOTE — Telephone Encounter (Signed)
Recent labs indicate the following:  Cholesterol remains elevated which increases risk for a cardiovascular event. Currently prescribed statin therapy with lovastatin 10 mg will increase atorvastatin to 20 mg once daily.  Ensure that you are taking dose of atorvastatin with dinner in the evening around 6 PM to increase to maximum effect of medication. A1c remains outside of recommended goal of less than 7.  Current A1c is 9.4.  He will continue current diabetes medications and current dose of insulin however I will raise metformin to 1000 mg twice daily and add glipizide 5 mg once daily and take with either breakfast or dinner.  Thyroid level and PSA screening were within normal range.  Would like for patient to follow-up in 6 weeks for diabetes and blood pressure follow-up.  Strongly encourage engaging in routine physical activity consistently and increasing intake of vegetables and fruits.  Also avoid eating excessive levels of foods rich in carbohydrates.

## 2019-04-21 NOTE — Telephone Encounter (Signed)
Left VM to call back 

## 2019-04-23 ENCOUNTER — Telehealth: Payer: Self-pay | Admitting: Family Medicine

## 2019-04-23 MED ORDER — METOPROLOL SUCCINATE ER 25 MG PO TB24: 25.0000 mg | ORAL_TABLET | Freq: Every day | ORAL | 1 refills | Status: DC

## 2019-04-23 MED ORDER — AMLODIPINE BESYLATE 10 MG PO TABS: 10.0000 mg | ORAL_TABLET | Freq: Every day | ORAL | 1 refills | Status: DC

## 2019-04-23 MED ORDER — LEVOTHYROXINE SODIUM 100 MCG PO TABS: 100.0000 ug | ORAL_TABLET | ORAL | 1 refills | Status: DC

## 2019-04-23 MED ORDER — CITALOPRAM HYDROBROMIDE 20 MG PO TABS: 20.0000 mg | ORAL_TABLET | Freq: Every day | ORAL | 1 refills | Status: DC

## 2019-04-23 MED ORDER — ATORVASTATIN CALCIUM 20 MG PO TABS: 20.0000 mg | ORAL_TABLET | Freq: Every day | ORAL | 3 refills | Status: DC

## 2019-04-23 MED ORDER — METFORMIN HCL 1000 MG PO TABS: 1000.0000 mg | ORAL_TABLET | Freq: Two times a day (BID) | ORAL | 3 refills | Status: DC

## 2019-04-23 MED ORDER — INSULIN ASPART 100 UNIT/ML FLEXPEN: 8.0000 [IU] | PEN_INJECTOR | Freq: Three times a day (TID) | SUBCUTANEOUS | 1 refills | Status: DC

## 2019-04-23 MED ORDER — CANAGLIFLOZIN 100 MG PO TABS: 100.0000 mg | ORAL_TABLET | Freq: Every day | ORAL | 1 refills | Status: DC

## 2019-04-23 MED ORDER — LOSARTAN POTASSIUM-HCTZ 100-12.5 MG PO TABS: 1.0000 | ORAL_TABLET | Freq: Every day | ORAL | 1 refills | Status: DC

## 2019-04-23 MED ORDER — INSULIN DETEMIR 100 UNIT/ML FLEXPEN: 50.0000 [IU] | PEN_INJECTOR | Freq: Two times a day (BID) | SUBCUTANEOUS | 1 refills | Status: DC

## 2019-04-23 MED ORDER — GLIPIZIDE 5 MG PO TABS: 5.0000 mg | ORAL_TABLET | Freq: Every day | ORAL | 1 refills | Status: DC

## 2019-04-23 NOTE — Telephone Encounter (Signed)
erroneous

## 2019-04-23 NOTE — Addendum Note (Signed)
Addended by: Heidi Dach on: 04/23/2019 05:16 PM   Modules accepted: Orders

## 2019-04-23 NOTE — Telephone Encounter (Signed)
Spoke with patient & notified him of lab results & recommendations. Expressed understanding. Is agreeable to all medication changes. Patient did let me know that with his insurance he is able to get a 30 day prescription once from a local pharmacy & then he is required to use a mail order. Insurance's preferred mail order is [facility]. Will send all prescriptions there.

## 2019-04-23 NOTE — Telephone Encounter (Signed)
Left voice mail to call back 

## 2019-04-23 NOTE — Telephone Encounter (Signed)
Paperwork received on @TODAY @   Type of paperwork: FMLA  Route received: Patient left at office   Has patient completed their portion of the paperwork: Yes  Has office staff updated demographics of paperwork: Yes  Paperwork routed to provider : Yes  Patient would like FMLA papers to reflect that sometimes he calls out because his blood sugar drops too low or is too high and he works with heavy machinery. Patient gets points when he calls out of work.

## 2019-04-28 ENCOUNTER — Telehealth: Payer: Self-pay | Admitting: Family Medicine

## 2019-04-28 MED ORDER — INSULIN LISPRO (1 UNIT DIAL) 100 UNIT/ML (KWIKPEN): 8.0000 [IU] | PEN_INJECTOR | Freq: Three times a day (TID) | SUBCUTANEOUS | 11 refills | Status: DC

## 2019-04-28 NOTE — Telephone Encounter (Signed)
Bonita from [facility] called regarding the patients novolog flexpen, she stated that his plan doesn't cover some medications including the novolog flexpen. The alternative that is covered by his plan would be the [employer], they would like to know if that could be switched, please follow up.    Reference Number: [id number]

## 2019-04-28 NOTE — Telephone Encounter (Signed)
Notify [facility] Novolog discontinued and new prescription sent for covered Humalog.

## 2019-04-28 NOTE — Telephone Encounter (Signed)
[facility] notified.

## 2019-04-29 ENCOUNTER — Other Ambulatory Visit: Payer: Self-pay

## 2019-04-29 ENCOUNTER — Ambulatory Visit (INDEPENDENT_AMBULATORY_CARE_PROVIDER_SITE_OTHER): Payer: BLUE CROSS/BLUE SHIELD

## 2019-04-29 ENCOUNTER — Telehealth: Payer: Self-pay | Admitting: Family Medicine

## 2019-04-29 DIAGNOSIS — I10 Essential (primary) hypertension: Secondary | ICD-10-CM

## 2019-04-29 MED ORDER — SILDENAFIL CITRATE 100 MG PO TABS: 100.0000 mg | ORAL_TABLET | ORAL | 1 refills | Status: AC | PRN

## 2019-04-29 MED ORDER — TOPIRAMATE 25 MG PO TABS: 25.0000 mg | ORAL_TABLET | Freq: Every day | ORAL | 1 refills | Status: DC

## 2019-04-29 NOTE — Progress Notes (Addendum)
Called patient to initiate their telephone visit with CMA for BP check. Verified date of birth. Patient states that he has been taking all medications as prescribed. Denies any chest pain, SHOB, headaches, lower extremity swelling. States that readings at home have been in the 110s-120s/80s. Pulses have been in the 70s-80s. Provider states to continue all current medications. To notify office if systolic reading goes below 100 & if diastolic reading goes below 60. Patient made follow up appointment on 06/15/19 @ 8:50 AM. Otis Peak, CMA.

## 2019-04-29 NOTE — Telephone Encounter (Signed)
Patient called to leave the number for [facility] [phone], it is regarding his prescription sildenafil (VIAGRA) 100 MG tablet [[id number]], please follow up.

## 2019-04-30 NOTE — Telephone Encounter (Signed)
FMLA paperwork was faxed with office notes, faxed confirmation was given to PCP with copy of paperwork.  FMLA was scanned directly into EMR, and patient was notified.

## 2019-04-30 NOTE — Telephone Encounter (Signed)
FMLA paperwork completed. Fax to the attention: Rexford Maus [phone] Include FMLA documents, office notes from 5/13, 5/16, and 5/27. Scan FMLA paperwork directly to EMR.  Return a copy of paperwork to provider along with faxed confirmation.

## 2019-05-21 ENCOUNTER — Encounter (HOSPITAL_COMMUNITY): Payer: Self-pay | Admitting: Emergency Medicine

## 2019-05-21 ENCOUNTER — Other Ambulatory Visit: Payer: Self-pay

## 2019-05-21 ENCOUNTER — Emergency Department (HOSPITAL_COMMUNITY)
Admission: EM | Admit: 2019-05-21 | Discharge: 2019-05-21 | Disposition: A | Discharge status: Home / Self Care | Payer: BC Managed Care – PPO | Attending: Emergency Medicine | Admitting: Emergency Medicine

## 2019-05-21 DIAGNOSIS — M25511 Pain in right shoulder: Secondary | ICD-10-CM | POA: Diagnosis not present

## 2019-05-21 DIAGNOSIS — I10 Essential (primary) hypertension: Secondary | ICD-10-CM | POA: Diagnosis not present

## 2019-05-21 DIAGNOSIS — Z79899 Other long term (current) drug therapy: Secondary | ICD-10-CM | POA: Diagnosis not present

## 2019-05-21 DIAGNOSIS — E119 Type 2 diabetes mellitus without complications: Secondary | ICD-10-CM | POA: Diagnosis not present

## 2019-05-21 DIAGNOSIS — Z794 Long term (current) use of insulin: Secondary | ICD-10-CM | POA: Diagnosis not present

## 2019-05-21 DIAGNOSIS — E039 Hypothyroidism, unspecified: Secondary | ICD-10-CM | POA: Insufficient documentation

## 2019-05-21 DIAGNOSIS — Z87891 Personal history of nicotine dependence: Secondary | ICD-10-CM | POA: Insufficient documentation

## 2019-05-21 DIAGNOSIS — Z7982 Long term (current) use of aspirin: Secondary | ICD-10-CM | POA: Insufficient documentation

## 2019-05-21 NOTE — ED Triage Notes (Signed)
Pt here with right shoulder pain after working  Yesterday and sleeping on ,it , has been having problems with the shoulder

## 2019-05-21 NOTE — Discharge Instructions (Addendum)
Please read attached information. If you experience any new or worsening signs or symptoms please return to the emergency room for evaluation. Please follow-up with your primary care provider or specialist as discussed.  °

## 2019-05-21 NOTE — ED Provider Notes (Signed)
[facility]   CSN: [id number] Arrival date & time: 05/21/19  1607    History   Chief Complaint Chief Complaint  Patient presents with  . Shoulder Pain    HPI Danny Macdonald is a [age] y.o. male.     HPI   48 year old male presents today with complaints of right shoulder pain.  Patient notes he injured his shoulder several years ago while pushing a TV up on a wall.  He notes since that time he has had pain in the right shoulder.  He notes this is worse with overhead movements.  He notes this is progressively worsened with no acute worsening.  Denies any fever or swelling.  He denies any distal neurological deficits.  No new injuries.  He is right-hand dominant.  He notes ibuprofen does not improve his symptoms.  He denies any chest pain.  Past Medical History:  Diagnosis Date  . Anxiety   . Carpal tunnel syndrome, right   . Erectile dysfunction   . Essential hypertension   . Hyperlipidemia   . Hypothyroidism   . Left ventricular dysfunction   . OSA (obstructive sleep apnea)   . Uncontrolled type 2 diabetes mellitus with hyperglycemia (HCC)     There are no active problems to display for this patient.   History reviewed. No pertinent surgical history.      Home Medications    Prior to Admission medications   Medication Sig Start Date End Date Taking? Authorizing Provider  amLODipine (NORVASC) 10 MG tablet Take 1 tablet (10 mg total) by mouth at bedtime. 04/23/19   Bing NeighborsHarris, Kimberly S, FNP  aspirin EC 81 MG tablet Take 1 tablet by mouth daily. 11/11/18   [provider]  atorvastatin (LIPITOR) 20 MG tablet Take 1 tablet (20 mg total) by mouth daily. 04/23/19   Bing NeighborsHarris, Kimberly S, FNP  canagliflozin (INVOKANA) 100 MG TABS tablet Take 1 tablet (100 mg total) by mouth daily. 04/23/19   Bing NeighborsHarris, Kimberly S, FNP  citalopram (CELEXA) 20 MG tablet Take 1 tablet (20 mg total) by mouth daily. 04/23/19   Bing NeighborsHarris, Kimberly S,  FNP  glipiZIDE (GLUCOTROL) 5 MG tablet Take 1 tablet (5 mg total) by mouth daily. WITH MEAL 04/23/19   Bing NeighborsHarris, Kimberly S, FNP  Insulin Detemir (LEVEMIR FLEXTOUCH) 100 UNIT/ML Pen Inject 50 Units into the skin 2 (two) times daily. 04/23/19   Bing NeighborsHarris, Kimberly S, FNP  insulin lispro (HUMALOG KWIKPEN) 100 UNIT/ML KwikPen Inject 0.08 mLs (8 Units total) into the skin 3 (three) times daily. 04/28/19   Bing NeighborsHarris, Kimberly S, FNP  levothyroxine (SYNTHROID) 100 MCG tablet Take 1 tablet (100 mcg total) by mouth every morning. 04/23/19   Bing NeighborsHarris, Kimberly S, FNP  losartan-hydrochlorothiazide (HYZAAR) 100-12.5 MG tablet Take 1 tablet by mouth daily. 04/23/19   Bing NeighborsHarris, Kimberly S, FNP  metFORMIN (GLUCOPHAGE) 1000 MG tablet Take 1 tablet (1,000 mg total) by mouth 2 (two) times daily with a meal. 04/23/19   Bing NeighborsHarris, Kimberly S, FNP  metoprolol succinate (TOPROL-XL) 25 MG 24 hr tablet Take 1 tablet (25 mg total) by mouth daily. 04/23/19   Bing NeighborsHarris, Kimberly S, FNP  sildenafil (VIAGRA) 100 MG tablet Take 1 tablet (100 mg total) by mouth as needed for erectile dysfunction. 04/29/19   Bing NeighborsHarris, Kimberly S, FNP  topiramate (TOPAMAX) 25 MG tablet Take 1 tablet (25 mg total) by mouth daily. 04/29/19   Bing NeighborsHarris, Kimberly S, FNP    Family History Family History  Problem Relation Age  of Onset  . Arthritis Mother   . Diabetes Mother   . Hypertension Mother   . Hypertension Father   . Hypertension Sister   . Hypertension Brother   . Diabetes Maternal Grandmother   . Hypertension Maternal Grandmother   . Diabetes Maternal Grandfather   . Hypertension Maternal Grandfather   . Hypertension Paternal Grandmother   . Hypertension Paternal Grandfather   . Diabetes Child     Social History Social History   Tobacco Use  . Smoking status: Former Smoker    Quit date: 10/26/2013    Years since quitting: 5.5  . Smokeless tobacco: Never Used  Substance Use Topics  . Alcohol use: Yes    Comment: ocassionally  . Drug use: No      Allergies   Patient has no known allergies.   Review of Systems Review of Systems  All other systems reviewed and are negative.    Physical Exam Updated Vital Signs BP (!) 152/82 (BP Location: Left Arm)   Pulse 66   Temp 98.8 F (37.1 C) (Oral)   Resp 16   SpO2 96%   Physical Exam Vitals signs and nursing note reviewed.  Constitutional:      Appearance: He is well-developed.  HENT:     Head: Normocephalic and atraumatic.  Eyes:     General: No scleral icterus.       Right eye: No discharge.        Left eye: No discharge.     Conjunctiva/sclera: Conjunctivae normal.     Pupils: Pupils are equal, round, and reactive to light.  Neck:     Musculoskeletal: Normal range of motion.     Vascular: No JVD.     Trachea: No tracheal deviation.  Pulmonary:     Effort: Pulmonary effort is normal.     Breath sounds: No stridor.  Musculoskeletal:     Comments: Right shoulder atraumatic with no tenderness to palpation, pain with shoulder extension and external rotation-radial pulse 2+ grip strength 5 out of 5 sensation intact  Neurological:     Mental Status: He is alert and oriented to person, place, and time.     Coordination: Coordination normal.  Psychiatric:        Behavior: Behavior normal.        Thought Content: Thought content normal.        Judgment: Judgment normal.     ED Treatments / Results  Labs (all labs ordered are listed, but only abnormal results are displayed) Labs Reviewed - No data to display  EKG None  Radiology No results found.  Procedures Procedures (including critical care time)  Medications Ordered in ED Medications - No data to display   Initial Impression / Assessment and Plan / ED Course  I have reviewed the triage vital signs and the nursing notes.  Pertinent labs & imaging results that were available during my care of the patient were reviewed by me and considered in my medical decision making (see chart for details).         Assessment/Plan: 48 year old male presents today with complaints of right shoulder pain.  This is chronic in nature.  Low suspicion for acute bony abnormality.  Patient referred to orthopedics for further evaluation management.  Strict return precautions given.  He verbalized understanding and agreement to today's plan had no further questions or concerns at time of discharge.    Final Clinical Impressions(s) / ED Diagnoses   Final diagnoses:  Acute pain of right  shoulder    ED Discharge Orders    None       Francee Gentile 05/21/19 Elizebeth Brooking, MD 05/21/19 2312

## 2019-05-21 NOTE — ED Notes (Signed)
Patient verbalizes understanding of discharge instructions. Opportunity for questioning and answers were provided. Armband removed by staff, pt discharged from ED.  

## 2019-05-26 ENCOUNTER — Ambulatory Visit (INDEPENDENT_AMBULATORY_CARE_PROVIDER_SITE_OTHER): Payer: BC Managed Care – PPO | Admitting: Orthopaedic Surgery

## 2019-05-26 ENCOUNTER — Other Ambulatory Visit: Payer: Self-pay

## 2019-05-26 ENCOUNTER — Encounter: Payer: Self-pay | Admitting: Orthopaedic Surgery

## 2019-05-26 ENCOUNTER — Other Ambulatory Visit: Payer: Self-pay | Admitting: Orthopaedic Surgery

## 2019-05-26 ENCOUNTER — Ambulatory Visit: Payer: Self-pay

## 2019-05-26 DIAGNOSIS — M25511 Pain in right shoulder: Secondary | ICD-10-CM

## 2019-05-26 NOTE — Progress Notes (Signed)
Office Visit Note   Patient: Danny Macdonald           Date of Birth: 05/04/1971           MRN: [id number] Visit Date: 05/26/2019              Requested by: Scot Jun, [address] [place],  [place] [address] PCP: Scot Jun, FNP   Assessment & Plan: Visit Diagnoses:  1. Right shoulder pain, unspecified chronicity     Plan: Impression is right shoulder AC arthropathy and rotator cuff tendinitis.  We will refer the patient to Dr. Junius Roads for an ultrasound-guided cortisone injection to the right [facility] joint.  Should the patient continues to have shoulder pain, he will follow back up for a subacromial cortisone injection.  Otherwise, follow-up with [place] as needed.  Call with concerns or questions in the meantime.  Follow-Up Instructions: Return if symptoms worsen or fail to improve.   Orders:  No orders of the defined types were placed in this encounter.  No orders of the defined types were placed in this encounter.     Procedures: No procedures performed   Clinical Data: No additional findings.   Subjective: No chief complaint on file.   HPI patient is a pleasant 48 year old right-hand-dominant gentleman who presents our clinic today with recurrent right shoulder pain.  Approximately 5 years ago he hurt his shoulder moving a TV.  He notes he was seen by the ED at that point where MRI was obtained.  He thinks he may have had a rotator cuff tear.  He was never given a cortisone injection or underwent surgical intervention.  He was given Norco to help alleviate his pain.  During the course of the past 2 years, he changed jobs and now does more physical work.  His shoulder has recently began to hurt worse.  He comes in today for further evaluation and treatment recommendation.  He denies any new injury.  The pain he has is primarily to the [facility] joint and radiates into the deltoid.  Pain is described as a constant ache worse with abduction and internal rotation  of the shoulder.  He also notes significant stiffness first thing in the morning.  He has tried over-the-counter anti-inflammatories, heat and ice without relief of symptoms.  He denies any numbness, tingling or burning.  Review of Systems as detailed in HPI.  All others reviewed and are negative.   Objective: Vital Signs: There were no vitals taken for this visit.  Physical Exam well-developed and well-nourished gentleman in no acute distress.  Alert and oriented x3.  Ortho Exam examination of his right shoulder reveals full forward flexion.  He has limited abduction and internal rotation to the back pocket.  Markedly positive empty can and cross body adduction.  Negative drop arm.  He has marked tenderness over the [facility] joint.  He is neurovascularly intact distally.  Specialty Comments:  No specialty comments available.  Imaging: Xr Shoulder Right  Result Date: 05/26/2019 X-rays show no superior migration of the humeral head.  He does have marked degenerative changes the [facility] joint.    PMFS History: There are no active problems to display for this patient.  Past Medical History:  Diagnosis Date  . Anxiety   . Carpal tunnel syndrome, right   . Erectile dysfunction   . Essential hypertension   . Hyperlipidemia   . Hypothyroidism   . Left ventricular dysfunction   . OSA (obstructive sleep  apnea)   . Uncontrolled type 2 diabetes mellitus with hyperglycemia (HCC)     Family History  Problem Relation Age of Onset  . Arthritis Mother   . Diabetes Mother   . Hypertension Mother   . Hypertension Father   . Hypertension Sister   . Hypertension Brother   . Diabetes Maternal Grandmother   . Hypertension Maternal Grandmother   . Diabetes Maternal Grandfather   . Hypertension Maternal Grandfather   . Hypertension Paternal Grandmother   . Hypertension Paternal Grandfather   . Diabetes Child     History reviewed. No pertinent surgical history. Social History   Occupational  History  . Not on file  Tobacco Use  . Smoking status: Former Smoker    Quit date: 10/26/2013    Years since quitting: 5.5  . Smokeless tobacco: Never Used  Substance and Sexual Activity  . Alcohol use: Yes    Comment: ocassionally  . Drug use: No  . Sexual activity: Not Currently

## 2019-05-26 NOTE — Progress Notes (Signed)
Subjective: He is here for ultrasound-guided right AC joint injection.  Chronic pain on top of his shoulder.  Objective: Point tender over the [facility] joint with a positive AC crossover test.  Procedure: Ultrasound-guided right AC joint injection: After sterile prep with Betadine, injected 5 cc 1% lidocaine without epinephrine and 40 mg methylprednisolone into the [facility] joint without complication.  Excellent pain relief during the immediate anesthetic phase.  Follow-up as directed.

## 2019-06-08 ENCOUNTER — Telehealth: Payer: Self-pay

## 2019-06-08 ENCOUNTER — Encounter (HOSPITAL_BASED_OUTPATIENT_CLINIC_OR_DEPARTMENT_OTHER): Payer: BLUE CROSS/BLUE SHIELD

## 2019-06-08 NOTE — Telephone Encounter (Signed)
Called patient to do their pre-visit COVID screening.  Call went to voicemail. Unable to do prescreening.  

## 2019-06-09 ENCOUNTER — Encounter: Payer: Self-pay | Admitting: Family Medicine

## 2019-06-09 ENCOUNTER — Other Ambulatory Visit: Payer: Self-pay

## 2019-06-09 ENCOUNTER — Ambulatory Visit (INDEPENDENT_AMBULATORY_CARE_PROVIDER_SITE_OTHER): Payer: BC Managed Care – PPO | Admitting: Family Medicine

## 2019-06-09 VITALS — BP 148/94 | HR 87 | Temp 97.5°F | Resp 17 | Ht 61.0 in | Wt 213.8 lb

## 2019-06-09 DIAGNOSIS — E782 Mixed hyperlipidemia: Secondary | ICD-10-CM | POA: Diagnosis not present

## 2019-06-09 DIAGNOSIS — E1159 Type 2 diabetes mellitus with other circulatory complications: Secondary | ICD-10-CM

## 2019-06-09 DIAGNOSIS — I10 Essential (primary) hypertension: Secondary | ICD-10-CM

## 2019-06-09 DIAGNOSIS — Z794 Long term (current) use of insulin: Secondary | ICD-10-CM

## 2019-06-09 MED ORDER — BUPROPION HCL ER (SR) 200 MG PO TB12: 200.0000 mg | ORAL_TABLET | Freq: Two times a day (BID) | ORAL | 2 refills | Status: DC

## 2019-06-09 MED ORDER — LEVEMIR FLEXTOUCH 100 UNIT/ML ~~LOC~~ SOPN: 50.0000 [IU] | PEN_INJECTOR | Freq: Two times a day (BID) | SUBCUTANEOUS | 1 refills | Status: DC

## 2019-06-09 NOTE — Progress Notes (Signed)
Patient ID: Danny GilmoreRoland Rigel, male    DOB: [birth year], 48 y.o.   MRN: [id number]  PCP: Bing NeighborsHarris, Darrin Apodaca S, FNP  Chief Complaint  Patient presents with  . Diabetes  . Hypertension  . Hyperlipidemia    Subjective:  HPI  Danny GilmoreRoland Stall is a 48 y.o. male presents for evaluation of hypertension and diabetes.  Luka Burgesshas Anosmia; Anxiety disorder; BMI 37.0-37.9, adult; Carpal tunnel syndrome of right wrist; Essential (primary) hypertension; Hyperlipidemia; Hypothyroidism; Left ventricular dysfunction; Male erectile dysfunction, unspecified; and Nasal septal deviation on their problem list.   Hypertension No home monitoring of blood pressure. Current prescribed Norvasc and losartan/HCTZ.  He is a former smoker. He is checking blood pressure at lease once daily. Home readings have remained 120-130 systolic and less than 90 diastolic.  Denies SOB or lower extremity swelling. Admits to history of poor dietary habits including not limiting foods according to sodium content. Recently, he started intermittent fasting.  He reports choosing healthy options during the times that he is eating.  He is trying to integrate exercise such as walking into daily holidays.  He is motivated to lose weight. Current Body mass index is 40.4 kg/m.  No recent issues with shortness of breath or dizziness noted he is compliant with blood pressure medications.  He is a non-smoker.  He suffers from sleep apnea is currently compliant with CPAP.  Type 2 Diabetes  Patient suffers from diabetes.  His most recent A1c was 9.4 indicating very poor control.  He has a history of poorly controlled diabetes.  His current regimen includes Invokana, glipizide, Levemir.  He is also prescribed rapid acting insulin although next today he has not had to use it.  He reports blood sugars have been consistently ranged 130s fasting.  He eats at least 2 meals per day as he works third third shift.  Social History   Socioeconomic History  .  Marital status: Significant Other    Spouse name: Not on file  . Number of children: Not on file  . Years of education: Not on file  . Highest education level: Not on file  Occupational History  . Not on file  Social Needs  . Financial resource strain: Not on file  . Food insecurity    Worry: Not on file    Inability: Not on file  . Transportation needs    Medical: Not on file    Non-medical: Not on file  Tobacco Use  . Smoking status: Former Smoker    Quit date: 10/26/2013    Years since quitting: 5.6  . Smokeless tobacco: Never Used  Substance and Sexual Activity  . Alcohol use: Yes    Comment: ocassionally  . Drug use: No  . Sexual activity: Not Currently  Lifestyle  . Physical activity    Days per week: Not on file    Minutes per session: Not on file  . Stress: Not on file  Relationships  . Social Musicianconnections    Talks on phone: Not on file    Gets together: Not on file    Attends religious service: Not on file    Active member of club or organization: Not on file    Attends meetings of clubs or organizations: Not on file    Relationship status: Not on file  . Intimate partner violence    Fear of current or ex partner: Not on file    Emotionally abused: Not on file    Physically abused: Not on file  Forced sexual activity: Not on file  Other Topics Concern  . Not on file  Social History Narrative  . Not on file    Family History  Problem Relation Age of Onset  . Arthritis Mother   . Diabetes Mother   . Hypertension Mother   . Hypertension Father   . Hypertension Sister   . Hypertension Brother   . Diabetes Maternal Grandmother   . Hypertension Maternal Grandmother   . Diabetes Maternal Grandfather   . Hypertension Maternal Grandfather   . Hypertension Paternal Grandmother   . Hypertension Paternal Grandfather   . Diabetes Child     Review of Systems  Pertinent negatives listed in HPI  No Known Allergies  Prior to Admission medications    Medication Sig Start Date End Date Taking? Authorizing Provider  amLODipine (NORVASC) 10 MG tablet Take 1 tablet (10 mg total) by mouth at bedtime. 04/23/19  Yes Scot Jun, FNP  aspirin EC 81 MG tablet Take 1 tablet by mouth daily. 11/11/18  Yes [provider]  atorvastatin (LIPITOR) 20 MG tablet Take 1 tablet (20 mg total) by mouth daily. 04/23/19  Yes Scot Jun, FNP  canagliflozin (INVOKANA) 100 MG TABS tablet Take 1 tablet (100 mg total) by mouth daily. 04/23/19  Yes Scot Jun, FNP  citalopram (CELEXA) 20 MG tablet Take 1 tablet (20 mg total) by mouth daily. Patient taking differently: Take 10 mg by mouth daily.  04/23/19  Yes Scot Jun, FNP  glipiZIDE (GLUCOTROL) 5 MG tablet Take 1 tablet (5 mg total) by mouth daily. WITH MEAL 04/23/19  Yes Scot Jun, FNP  Insulin Detemir (LEVEMIR FLEXTOUCH) 100 UNIT/ML Pen Inject 50 Units into the skin 2 (two) times daily. 04/23/19  Yes Scot Jun, FNP  insulin lispro (HUMALOG KWIKPEN) 100 UNIT/ML KwikPen Inject 0.08 mLs (8 Units total) into the skin 3 (three) times daily. 04/28/19  Yes Scot Jun, FNP  levothyroxine (SYNTHROID) 100 MCG tablet Take 1 tablet (100 mcg total) by mouth every morning. 04/23/19  Yes Scot Jun, FNP  losartan-hydrochlorothiazide (HYZAAR) 100-12.5 MG tablet Take 1 tablet by mouth daily. 04/23/19  Yes Scot Jun, FNP  metFORMIN (GLUCOPHAGE) 1000 MG tablet Take 1 tablet (1,000 mg total) by mouth 2 (two) times daily with a meal. 04/23/19  Yes Scot Jun, FNP  metoprolol succinate (TOPROL-XL) 25 MG 24 hr tablet Take 1 tablet (25 mg total) by mouth daily. 04/23/19  Yes Scot Jun, FNP  sildenafil (VIAGRA) 100 MG tablet Take 1 tablet (100 mg total) by mouth as needed for erectile dysfunction. 04/29/19  Yes Scot Jun, FNP  topiramate (TOPAMAX) 25 MG tablet Take 1 tablet (25 mg total) by mouth daily. 04/29/19  Yes Scot Jun, FNP     Past Medical, Surgical Family and Social History reviewed and updated.    Objective:   Today's Vitals   06/09/19 0852  BP: (!) 148/94  Pulse: 87  Resp: 17  Temp: (!) 97.5 F (36.4 C)  TempSrc: Temporal  SpO2: 97%  Weight: 213 lb 12.8 oz (97 kg)  Height: 5\' 1"  (1.549 m)    BP Readings from Last 3 Encounters:  06/09/19 (!) 148/94  05/21/19 (!) 152/82  04/15/19 (!) 146/91    Filed Weights   06/09/19 0852  Weight: 213 lb 12.8 oz (97 kg)     Physical Exam General appearance: alert, well developed, well nourished, cooperative and in no distress Head: Normocephalic, without obvious  abnormality, atraumatic Respiratory: Respirations even and unlabored, normal respiratory rate Heart: rate and rhythm normal. No gallop or murmurs noted on exam  Abdomen: BS +, no distention, no rebound tenderness, or no mass Extremities: No gross deformities Skin: Skin color, texture, turgor normal. No rashes seen  Psych: Appropriate mood and affect. Neurologic: Mental status: Alert, oriented to person, place, and time, thought content appropriate. No results found for: POCGLU  Lab Results  Component Value Date   HGBA1C 9.4 (H) 04/15/2019    Assessment & Plan:  1. Mixed hyperlipidemia -Continue atorvastatin 10 mg   - Lipid panel  2. Type 2 diabetes mellitus with other circulatory complication, with long-term current use of insulin (HCC),  Aim for 30 minutes of exercise most days, with a goal of 150 minutes per week. -Glucose monitoring at minimal of twice daily and keep a log of readings. -Commit to medication adherence and self-adjustment as needed -increase foods containing whole grains (one-half of grain intake). -saturated fat intake should be reduced -reduce intake of trans fat (lowers LDL cholesterol and increases HDL cholesterol) -Eat 4-5 small meals during the day to reduce the risk of becoming hungry. Continue Levemir, glipizide, and metformin. - Hemoglobin A1c  3.  Essential hypertension, elevated today Continue losartan-HCTZ,metoprolol, amlodipine We have discussed target BP range and blood pressure goal. I have advised patient to check BP regularly and to call us back or report to clinic if the numbers are consistently higher than 140/90. We discussed the importance of compliance with medical therapy and DASH diet recommended, consequences of uncontrolled hypertension discussed.  - Checking comprehensive metabolic panel  Return for follow-up in 3 months diabetes and hypertension follow-up    Orders Placed This Encounter  Procedures  . Pneumococcal polysaccharide vaccine 23-valent greater than or equal to 2yo subcutaneous/IM  . Hemoglobin A1c  . Lipid panel    Order Specific Question:   Has the patient fasted?    Answer:   No  . Comprehensive metabolic panel    Order Specific Question:   Has the patient fasted?    Answer:   No     Joaquin CourtsKimberly Alvin Diffee, FNP [facility] [address], [place] [id number] [phone]: [phone]

## 2019-06-09 NOTE — Patient Instructions (Signed)

## 2019-06-10 LAB — LIPID PANEL
Chol/HDL Ratio: 4.6 ratio (ref 0.0–5.0)
Cholesterol, Total: 193 mg/dL (ref 100–199)
HDL: 42 mg/dL (ref >39)
LDL Calculated: 126 mg/dL — ABNORMAL HIGH (ref 0–99)
Triglycerides: 126 mg/dL (ref 0–149)
VLDL Cholesterol Cal: 25 mg/dL (ref 5–40)

## 2019-06-10 LAB — COMPREHENSIVE METABOLIC PANEL
ALT: 21 IU/L (ref 0–44)
AST: 21 IU/L (ref 0–40)
Albumin/Globulin Ratio: 1.9 (ref 1.2–2.2)
Albumin: 4.6 g/dL (ref 4.0–5.0)
Alkaline Phosphatase: 75 IU/L (ref 39–117)
BUN/Creatinine Ratio: 24 — ABNORMAL HIGH (ref 9–20)
BUN: 23 mg/dL (ref 6–24)
Bilirubin Total: <0.2 mg/dL (ref 0.0–1.2)
CO2: 22 mmol/L (ref 20–29)
Calcium: 9.4 mg/dL (ref 8.7–10.2)
Chloride: 103 mmol/L (ref 96–106)
Creatinine, Ser: 0.97 mg/dL (ref 0.76–1.27)
GFR calc Af Amer: 108 mL/min/{1.73_m2} (ref >59)
GFR calc non Af Amer: 93 mL/min/{1.73_m2} (ref >59)
Globulin, Total: 2.4 g/dL (ref 1.5–4.5)
Glucose: 144 mg/dL — ABNORMAL HIGH (ref 65–99)
Potassium: 4.2 mmol/L (ref 3.5–5.2)
Sodium: 142 mmol/L (ref 134–144)
Total Protein: 7 g/dL (ref 6.0–8.5)

## 2019-06-10 LAB — HEMOGLOBIN A1C
Est. average glucose Bld gHb Est-mCnc: 214 mg/dL
Hgb A1c MFr Bld: 9.1 % — ABNORMAL HIGH (ref 4.8–5.6)

## 2019-06-11 ENCOUNTER — Other Ambulatory Visit (HOSPITAL_COMMUNITY): Payer: BLUE CROSS/BLUE SHIELD

## 2019-06-11 NOTE — Progress Notes (Signed)
Patient notified of results & recommendations. Expressed understanding. Made a 3 month follow up for 8:50 AM on 09/09/2019.

## 2019-06-14 ENCOUNTER — Encounter (HOSPITAL_BASED_OUTPATIENT_CLINIC_OR_DEPARTMENT_OTHER): Payer: BLUE CROSS/BLUE SHIELD

## 2019-07-17 ENCOUNTER — Encounter (HOSPITAL_BASED_OUTPATIENT_CLINIC_OR_DEPARTMENT_OTHER): Payer: Self-pay | Admitting: Cardiovascular Disease

## 2019-07-22 ENCOUNTER — Ambulatory Visit (INDEPENDENT_AMBULATORY_CARE_PROVIDER_SITE_OTHER): Payer: BC Managed Care – PPO

## 2019-07-22 ENCOUNTER — Ambulatory Visit
Admission: EM | Admit: 2019-07-22 | Discharge: 2019-07-22 | Disposition: A | Discharge status: Home / Self Care | Payer: BC Managed Care – PPO | Attending: Emergency Medicine | Admitting: Emergency Medicine

## 2019-07-22 DIAGNOSIS — M25511 Pain in right shoulder: Secondary | ICD-10-CM | POA: Diagnosis not present

## 2019-07-22 DIAGNOSIS — M79671 Pain in right foot: Secondary | ICD-10-CM | POA: Diagnosis not present

## 2019-07-22 DIAGNOSIS — S99921A Unspecified injury of right foot, initial encounter: Secondary | ICD-10-CM | POA: Diagnosis not present

## 2019-07-22 DIAGNOSIS — M791 Myalgia, unspecified site: Secondary | ICD-10-CM | POA: Diagnosis not present

## 2019-07-22 MED ORDER — CYCLOBENZAPRINE HCL 5 MG PO TABS: 5.0000 mg | ORAL_TABLET | Freq: Two times a day (BID) | ORAL | 0 refills | Status: AC | PRN
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
## 2019-07-22 NOTE — Discharge Instructions (Addendum)
Take muscle relaxer as needed for severe pain, spasm. °May ice, rest, elevate area is causing most pain.  Can also use hot compresses/warm wash rags to relieve muscle tightness. °May use OTC Tylenol, ibuprofen as needed for pain. °Return if you develop worsening pain, chest pain, difficulty breathing. °
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
## 2019-07-27 ENCOUNTER — Emergency Department (HOSPITAL_COMMUNITY): Payer: BC Managed Care – PPO

## 2019-07-27 ENCOUNTER — Emergency Department (HOSPITAL_COMMUNITY)
Admission: EM | Admit: 2019-07-27 | Discharge: 2019-07-27 | Disposition: A | Discharge status: Home / Self Care | Payer: BC Managed Care – PPO | Attending: Emergency Medicine | Admitting: Emergency Medicine

## 2019-07-27 ENCOUNTER — Other Ambulatory Visit: Payer: Self-pay

## 2019-07-27 ENCOUNTER — Encounter (HOSPITAL_COMMUNITY): Payer: Self-pay

## 2019-07-27 DIAGNOSIS — E119 Type 2 diabetes mellitus without complications: Secondary | ICD-10-CM | POA: Insufficient documentation

## 2019-07-27 DIAGNOSIS — M25511 Pain in right shoulder: Secondary | ICD-10-CM | POA: Insufficient documentation

## 2019-07-27 DIAGNOSIS — I10 Essential (primary) hypertension: Secondary | ICD-10-CM | POA: Diagnosis not present

## 2019-07-27 DIAGNOSIS — Y92410 Unspecified street and highway as the place of occurrence of the external cause: Secondary | ICD-10-CM | POA: Insufficient documentation

## 2019-07-27 DIAGNOSIS — Z87891 Personal history of nicotine dependence: Secondary | ICD-10-CM | POA: Insufficient documentation

## 2019-07-27 DIAGNOSIS — E039 Hypothyroidism, unspecified: Secondary | ICD-10-CM | POA: Insufficient documentation

## 2019-07-27 DIAGNOSIS — Z79899 Other long term (current) drug therapy: Secondary | ICD-10-CM | POA: Insufficient documentation

## 2019-07-27 DIAGNOSIS — Z7982 Long term (current) use of aspirin: Secondary | ICD-10-CM | POA: Diagnosis not present

## 2019-07-27 DIAGNOSIS — Z794 Long term (current) use of insulin: Secondary | ICD-10-CM | POA: Insufficient documentation

## 2019-07-27 DIAGNOSIS — Y999 Unspecified external cause status: Secondary | ICD-10-CM | POA: Insufficient documentation

## 2019-07-27 DIAGNOSIS — S4991XA Unspecified injury of right shoulder and upper arm, initial encounter: Secondary | ICD-10-CM | POA: Diagnosis not present

## 2019-07-27 DIAGNOSIS — Y93I9 Activity, other involving external motion: Secondary | ICD-10-CM | POA: Insufficient documentation
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
## 2019-07-27 NOTE — ED Notes (Signed)
Pt verbalizes understanding of d/c instructions. Pt ambulatory at d/c with all belongings.   
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
## 2019-07-27 NOTE — Discharge Instructions (Addendum)
Please read attached information. If you experience any new or worsening signs or symptoms please return to the emergency room for evaluation. Please follow-up with your primary care provider or specialist as discussed.  °

## 2019-07-27 NOTE — ED Notes (Signed)
Pt transported to Xray. 

## 2019-08-06 ENCOUNTER — Ambulatory Visit (INDEPENDENT_AMBULATORY_CARE_PROVIDER_SITE_OTHER): Payer: BC Managed Care – PPO | Admitting: Family Medicine

## 2019-08-06 ENCOUNTER — Telehealth: Payer: Self-pay | Admitting: Family Medicine

## 2019-08-06 DIAGNOSIS — M545 Low back pain, unspecified: Secondary | ICD-10-CM

## 2019-08-06 DIAGNOSIS — M546 Pain in thoracic spine: Secondary | ICD-10-CM | POA: Diagnosis not present

## 2019-08-06 DIAGNOSIS — M25511 Pain in right shoulder: Secondary | ICD-10-CM | POA: Diagnosis not present

## 2019-08-06 MED ORDER — CYCLOBENZAPRINE HCL 10 MG PO TABS: 10.0000 mg | ORAL_TABLET | Freq: Every evening | ORAL | 0 refills | Status: DC | PRN

## 2019-08-06 NOTE — Progress Notes (Signed)
Patient states that his shoulder pain is 10/10. Describes pain as a sharp stabbing pain. Taking OTC Ibuprofen with no relief.
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
## 2019-08-18 ENCOUNTER — Other Ambulatory Visit (HOSPITAL_COMMUNITY): Payer: BC Managed Care – PPO

## 2019-08-21 ENCOUNTER — Encounter (HOSPITAL_BASED_OUTPATIENT_CLINIC_OR_DEPARTMENT_OTHER): Payer: Self-pay | Admitting: Cardiovascular Disease

## 2019-08-21 ENCOUNTER — Ambulatory Visit: Payer: BC Managed Care – PPO | Admitting: Orthopaedic Surgery

## 2019-09-09 ENCOUNTER — Ambulatory Visit: Payer: BC Managed Care – PPO

## 2019-09-09 ENCOUNTER — Encounter: Payer: Self-pay | Admitting: Orthopaedic Surgery

## 2019-09-09 ENCOUNTER — Ambulatory Visit (INDEPENDENT_AMBULATORY_CARE_PROVIDER_SITE_OTHER): Payer: Self-pay | Admitting: Orthopaedic Surgery

## 2019-09-09 ENCOUNTER — Telehealth: Payer: Self-pay

## 2019-09-09 VITALS — Ht 61.0 in | Wt 200.0 lb

## 2019-09-09 DIAGNOSIS — M25511 Pain in right shoulder: Secondary | ICD-10-CM

## 2019-09-09 DIAGNOSIS — G8929 Other chronic pain: Secondary | ICD-10-CM
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
## 2019-09-09 NOTE — Telephone Encounter (Signed)

## 2019-09-10 ENCOUNTER — Ambulatory Visit: Payer: BC Managed Care – PPO

## 2019-09-30 ENCOUNTER — Telehealth: Payer: Self-pay

## 2019-09-30 NOTE — Telephone Encounter (Signed)
Called patient to do their pre-visit COVID screening.  Call went to voicemail. Unable to do prescreening.  

## 2019-10-01 ENCOUNTER — Ambulatory Visit (INDEPENDENT_AMBULATORY_CARE_PROVIDER_SITE_OTHER): Payer: Managed Care, Other (non HMO) | Admitting: Family Medicine

## 2019-10-01 ENCOUNTER — Other Ambulatory Visit: Payer: Self-pay

## 2019-10-01 VITALS — BP 119/82 | HR 80 | Temp 97.7°F | Resp 17 | Wt 201.6 lb

## 2019-10-01 DIAGNOSIS — I10 Essential (primary) hypertension: Secondary | ICD-10-CM

## 2019-10-01 DIAGNOSIS — Z6838 Body mass index (BMI) 38.0-38.9, adult: Secondary | ICD-10-CM

## 2019-10-01 DIAGNOSIS — Z794 Long term (current) use of insulin: Secondary | ICD-10-CM | POA: Diagnosis not present

## 2019-10-01 DIAGNOSIS — E038 Other specified hypothyroidism: Secondary | ICD-10-CM

## 2019-10-01 DIAGNOSIS — E1159 Type 2 diabetes mellitus with other circulatory complications: Secondary | ICD-10-CM | POA: Diagnosis not present

## 2019-10-01 DIAGNOSIS — K219 Gastro-esophageal reflux disease without esophagitis: Secondary | ICD-10-CM | POA: Diagnosis not present

## 2019-10-01 DIAGNOSIS — F411 Generalized anxiety disorder: Secondary | ICD-10-CM

## 2019-10-01 DIAGNOSIS — E782 Mixed hyperlipidemia: Secondary | ICD-10-CM

## 2019-10-01 LAB — GLUCOSE, POCT (MANUAL RESULT ENTRY): POC Glucose: 105 mg/dl — AB (ref 70–99)

## 2019-10-01 MED ORDER — METFORMIN HCL 1000 MG PO TABS: 1000.0000 mg | ORAL_TABLET | Freq: Two times a day (BID) | ORAL | 3 refills | Status: AC

## 2019-10-01 MED ORDER — OMEPRAZOLE 40 MG PO CPDR: 40.0000 mg | DELAYED_RELEASE_CAPSULE | Freq: Every day | ORAL | 1 refills | Status: DC

## 2019-10-01 MED ORDER — BUPROPION HCL ER (SR) 200 MG PO TB12: 200.0000 mg | ORAL_TABLET | Freq: Two times a day (BID) | ORAL | 2 refills | Status: AC

## 2019-10-01 MED ORDER — AMLODIPINE BESYLATE 10 MG PO TABS: 10.0000 mg | ORAL_TABLET | Freq: Every day | ORAL | 1 refills | Status: AC

## 2019-10-01 MED ORDER — GLIPIZIDE 5 MG PO TABS: 5.0000 mg | ORAL_TABLET | Freq: Every day | ORAL | 1 refills | Status: AC

## 2019-10-01 MED ORDER — LEVEMIR FLEXTOUCH 100 UNIT/ML ~~LOC~~ SOPN: 50.0000 [IU] | PEN_INJECTOR | Freq: Two times a day (BID) | SUBCUTANEOUS | 1 refills | Status: AC

## 2019-10-01 MED ORDER — CANAGLIFLOZIN 100 MG PO TABS: 100.0000 mg | ORAL_TABLET | Freq: Every day | ORAL | 1 refills | Status: AC

## 2019-10-01 MED ORDER — LOSARTAN POTASSIUM-HCTZ 100-12.5 MG PO TABS: 1.0000 | ORAL_TABLET | Freq: Every day | ORAL | 1 refills | Status: AC

## 2019-10-01 MED ORDER — METOPROLOL SUCCINATE ER 25 MG PO TB24: 25.0000 mg | ORAL_TABLET | Freq: Every day | ORAL | 1 refills | Status: AC

## 2019-10-01 MED ORDER — ATORVASTATIN CALCIUM 20 MG PO TABS: 20.0000 mg | ORAL_TABLET | Freq: Every day | ORAL | 1 refills | Status: AC

## 2019-10-01 NOTE — Progress Notes (Signed)
Subjective:  Patient ID: Danny Macdonald, male    DOB: 11/25/1971  Age: 48 y.o. MRN: [id number]  CC: Diabetes, Hypertension, and Hyperlipidemia   HPI Danny Macdonald is a 48 year old male with type 2 diabetes mellitus (A1c 9.7), hypertension, hyperlipidemia, obesity who presents today for follow-up visit.  At his last visit he had complained of right shoulder pain and is status post AC corticosteroid joint injection by his orthopedics with significant improvement in his range of motion of his shoulder and he is pain-free at this time.  He complains of reflux which he thinks may be related to Wellbutrin but then states Wellbutrin has been effective and so he would like to discontinue Wellbutrin but is requesting something for reflux. Wellbutrin " keeps me thinking straight" he states that his anxiety has been controlled.  With regards to his diabetes mellitus his blood sugars run between 98 and 130 and he has had a 48 on one occasion and so to prevent hypoglycemia he usually tries to drink sweet drink at least once a day.  Denies hypoglycemic symptoms and works third shift with his last meal at 8 PM and does not take a late night snack. Compliant with Lipitor which he states caused cramps initially but now he feels fine on it. Doing well on his antihypertensive.  Past Medical History:  Diagnosis Date  . Anxiety   . Carpal tunnel syndrome, right   . Erectile dysfunction   . Essential hypertension   . Hyperlipidemia   . Hypothyroidism   . Left ventricular dysfunction   . OSA (obstructive sleep apnea)   . Uncontrolled type 2 diabetes mellitus with hyperglycemia (HCC)     No past surgical history on file.  Family History  Problem Relation Age of Onset  . Arthritis Mother   . Diabetes Mother   . Hypertension Mother   . Hypertension Father   . Hypertension Sister   . Hypertension Brother   . Diabetes Maternal Grandmother   . Hypertension Maternal Grandmother   . Diabetes Maternal  Grandfather   . Hypertension Maternal Grandfather   . Hypertension Paternal Grandmother   . Hypertension Paternal Grandfather   . Diabetes Child     No Known Allergies  Outpatient Medications Prior to Visit  Medication Sig Dispense Refill  . aspirin EC 81 MG tablet Take 1 tablet by mouth daily.    Marland Kitchen levothyroxine (SYNTHROID) 100 MCG tablet Take 1 tablet (100 mcg total) by mouth every morning. 90 tablet 1  . sildenafil (VIAGRA) 100 MG tablet Take 1 tablet (100 mg total) by mouth as needed for erectile dysfunction. 30 tablet 1  . amLODipine (NORVASC) 10 MG tablet Take 1 tablet (10 mg total) by mouth at bedtime. 90 tablet 1  . atorvastatin (LIPITOR) 20 MG tablet Take 1 tablet (20 mg total) by mouth daily. 90 tablet 3  . buPROPion (WELLBUTRIN SR) 200 MG 12 hr tablet Take 1 tablet (200 mg total) by mouth 2 (two) times daily. 60 tablet 2  . canagliflozin (INVOKANA) 100 MG TABS tablet Take 1 tablet (100 mg total) by mouth daily. 90 tablet 1  . glipiZIDE (GLUCOTROL) 5 MG tablet Take 1 tablet (5 mg total) by mouth daily. WITH MEAL 90 tablet 1  . Insulin Detemir (LEVEMIR FLEXTOUCH) 100 UNIT/ML Pen Inject 50 Units into the skin 2 (two) times daily. Hold dose for BS <150. 90 mL 1  . losartan-hydrochlorothiazide (HYZAAR) 100-12.5 MG tablet Take 1 tablet by mouth daily. 90 tablet 1  . metFORMIN (  GLUCOPHAGE) 1000 MG tablet Take 1 tablet (1,000 mg total) by mouth 2 (two) times daily with a meal. 180 tablet 3  . metoprolol succinate (TOPROL-XL) 25 MG 24 hr tablet Take 1 tablet (25 mg total) by mouth daily. 90 tablet 1  . cyclobenzaprine (FLEXERIL) 10 MG tablet Take 1 tablet (10 mg total) by mouth at bedtime as needed for muscle spasms. 30 tablet 0   No facility-administered medications prior to visit.      ROS Review of Systems  Constitutional: Negative for activity change and appetite change.  HENT: Negative for sinus pressure and sore throat.   Eyes: Negative for visual disturbance.  Respiratory:  Negative for cough, chest tightness and shortness of breath.   Cardiovascular: Negative for chest pain and leg swelling.  Gastrointestinal: Negative for abdominal distention, abdominal pain, constipation and diarrhea.  Endocrine: Negative.   Genitourinary: Negative for dysuria.  Musculoskeletal: Negative for joint swelling and myalgias.  Skin: Negative for rash.  Allergic/Immunologic: Negative.   Neurological: Negative for weakness, light-headedness and numbness.  Psychiatric/Behavioral: Negative for dysphoric mood and suicidal ideas.    Objective:  BP 119/82   Pulse 80   Temp 97.7 F (36.5 C) (Temporal)   Resp 17   Wt 201 lb 9.6 oz (91.4 kg)   SpO2 97%   BMI 38.09 kg/m   BP/Weight 10/01/2019 09/09/2019 07/27/2019  Systolic BP 119 - 115  Diastolic BP 82 - 72  Wt. (Lbs) 201.6 200 -  BMI 38.09 37.79 -      Physical Exam Constitutional:      Appearance: He is well-developed. He is obese.  Neck:     Vascular: No JVD.  Cardiovascular:     Rate and Rhythm: Normal rate.     Heart sounds: Normal heart sounds. No murmur.  Pulmonary:     Effort: Pulmonary effort is normal.     Breath sounds: Normal breath sounds. No wheezing or rales.  Chest:     Chest wall: No tenderness.  Abdominal:     General: Bowel sounds are normal. There is no distension.     Palpations: Abdomen is soft. There is no mass.     Tenderness: There is no abdominal tenderness.  Musculoskeletal: Normal range of motion.     Right lower leg: No edema.     Left lower leg: No edema.  Neurological:     Mental Status: He is alert and oriented to person, place, and time.  Psychiatric:        Mood and Affect: Mood normal.     CMP Latest Ref Rng & Units 06/09/2019 04/15/2019 11/16/2013  Glucose 65 - 99 mg/dL 967(R) 916(B) 846(K)  BUN 6 - 24 mg/dL 23 20 59(D)  Creatinine 0.76 - 1.27 mg/dL 3.57 0.17 7.93  Sodium 134 - 144 mmol/L 142 141 136  Potassium 3.5 - 5.2 mmol/L 4.2 3.5 4.1  Chloride 96 - 106 mmol/L 103  102 97  CO2 20 - 29 mmol/L 22 21 24   Calcium 8.7 - 10.2 mg/dL 9.4 9.3 9.0  Total Protein 6.0 - 8.5 g/dL 7.0 7.5 7.6  Total Bilirubin 0.0 - 1.2 mg/dL 0.3 <9.0)  Alkaline Phos 39 - 117 IU/L 75 80 99  AST 0 - 40 IU/L 21 36 32  ALT 0 - 44 IU/L 21 38 26    Lipid Panel     Component Value Date/Time   CHOL 193 06/09/2019 0933   TRIG 126 06/09/2019 0933   HDL 42 06/09/2019 0933  CHOLHDL 4.6 06/09/2019 0933   LDLCALC 126 (H) 06/09/2019 0933    CBC    Component Value Date/Time   WBC 7.4 04/15/2019 0901   WBC 8.9 11/16/2013 2124   RBC 4.87 04/15/2019 0901   RBC 4.58 11/16/2013 2124   HGB 14.7 04/15/2019 0901   HCT 42.8 04/15/2019 0901   PLT 254 04/15/2019 0901   MCV 88 04/15/2019 0901   MCH 30.2 04/15/2019 0901   MCH 30.3 11/16/2013 2124   MCHC 34.3 04/15/2019 0901   MCHC 34.7 11/16/2013 2124   RDW 13.6 04/15/2019 0901   LYMPHSABS 2.4 04/15/2019 0901   EOSABS 0.1 04/15/2019 0901   BASOSABS 0.0 04/15/2019 0901    Lab Results  Component Value Date   HGBA1C 9.1 (H) 06/09/2019    Assessment & Plan:   1. Type 2 diabetes mellitus with other circulatory complication, with long-term current use of insulin (HCC) Uncontrolled with A1c of 9.1; goal is less than 7 Blood sugar logs reveal some improvement; will send of A1c and adjust regimen accordingly Counseled on Diabetic diet, my plate method, 662 minutes of moderate intensity exercise/week Blood sugar logs with fasting goals of 80-120 mg/dl, random of less than 180 and in the event of sugars less than 60 mg/dl or greater than 400 mg/dl encouraged to notify the clinic. Counseled on management of hypoglycemia and advised to take a late night snack, avoid sweet drinks. Advised on the need for annual eye exams, annual foot exams, Pneumonia vaccine. - Glucose (CBG) - canagliflozin (INVOKANA) 100 MG TABS tablet; Take 1 tablet (100 mg total) by mouth daily.  Dispense: 90 tablet; Refill: 1 - glipiZIDE (GLUCOTROL) 5 MG tablet;  Take 1 tablet (5 mg total) by mouth daily. WITH MEAL  Dispense: 90 tablet; Refill: 1 - Insulin Detemir (LEVEMIR FLEXTOUCH) 100 UNIT/ML Pen; Inject 50 Units into the skin 2 (two) times daily. Hold dose for BS <150.  Dispense: 90 mL; Refill: 1 - metFORMIN (GLUCOPHAGE) 1000 MG tablet; Take 1 tablet (1,000 mg total) by mouth 2 (two) times daily with a meal.  Dispense: 180 tablet; Refill: 3 - Hemoglobin A1c - Lipid panel  2. Essential hypertension Controlled Counseled on blood pressure goal of less than 130/80, low-sodium, DASH diet, medication compliance, 150 minutes of moderate intensity exercise per week. Discussed medication compliance, adverse effects. - amLODipine (NORVASC) 10 MG tablet; Take 1 tablet (10 mg total) by mouth at bedtime.  Dispense: 90 tablet; Refill: 1 - losartan-hydrochlorothiazide (HYZAAR) 100-12.5 MG tablet; Take 1 tablet by mouth daily.  Dispense: 90 tablet; Refill: 1 - metoprolol succinate (TOPROL-XL) 25 MG 24 hr tablet; Take 1 tablet (25 mg total) by mouth daily.  Dispense: 90 tablet; Refill: 1  3. Mixed hyperlipidemia Uncontrolled with LDL of 126 which is above goal of less than 100 Low-cholesterol diet - atorvastatin (LIPITOR) 20 MG tablet; Take 1 tablet (20 mg total) by mouth daily.  Dispense: 90 tablet; Refill: 1  4. Other specified hypothyroidism Stable We will send off thyroid panel and adjust regimen accordingly - TSH - T4, free  5. Gastroesophageal reflux disease without esophagitis Commence PPI; avoid late meals and ensure recumbency up to 2 hours post meals - omeprazole (PRILOSEC) 40 MG capsule; Take 1 capsule (40 mg total) by mouth daily.  Dispense: 90 capsule; Refill: 1  6. Class 2 severe obesity due to excess calories with serious comorbidity and body mass index (BMI) of 38.0 to 38.9 in adult [place]) Counseled on 150 minutes of exercise per week, healthy  eating (including decreased daily intake of saturated fats, cholesterol, added sugars, sodium)He will  benefit from weight loss surgery - Amb Referral to Bariatric Surgery  7. Generalized anxiety disorder Stable - buPROPion (WELLBUTRIN SR) 200 MG 12 hr tablet; Take 1 tablet (200 mg total) by mouth 2 (two) times daily.  Dispense: 60 tablet; Refill: 2    Meds ordered this encounter  Medications  . amLODipine (NORVASC) 10 MG tablet    Sig: Take 1 tablet (10 mg total) by mouth at bedtime.    Dispense:  90 tablet    Refill:  1  . atorvastatin (LIPITOR) 20 MG tablet    Sig: Take 1 tablet (20 mg total) by mouth daily.    Dispense:  90 tablet    Refill:  1  . canagliflozin (INVOKANA) 100 MG TABS tablet    Sig: Take 1 tablet (100 mg total) by mouth daily.    Dispense:  90 tablet    Refill:  1  . buPROPion (WELLBUTRIN SR) 200 MG 12 hr tablet    Sig: Take 1 tablet (200 mg total) by mouth 2 (two) times daily.    Dispense:  60 tablet    Refill:  2  . glipiZIDE (GLUCOTROL) 5 MG tablet    Sig: Take 1 tablet (5 mg total) by mouth daily. WITH MEAL    Dispense:  90 tablet    Refill:  1  . Insulin Detemir (LEVEMIR FLEXTOUCH) 100 UNIT/ML Pen    Sig: Inject 50 Units into the skin 2 (two) times daily. Hold dose for BS <150.    Dispense:  90 mL    Refill:  1  . losartan-hydrochlorothiazide (HYZAAR) 100-12.5 MG tablet    Sig: Take 1 tablet by mouth daily.    Dispense:  90 tablet    Refill:  1  . metFORMIN (GLUCOPHAGE) 1000 MG tablet    Sig: Take 1 tablet (1,000 mg total) by mouth 2 (two) times daily with a meal.    Dispense:  180 tablet    Refill:  3  . metoprolol succinate (TOPROL-XL) 25 MG 24 hr tablet    Sig: Take 1 tablet (25 mg total) by mouth daily.    Dispense:  90 tablet    Refill:  1  . omeprazole (PRILOSEC) 40 MG capsule    Sig: Take 1 capsule (40 mg total) by mouth daily.    Dispense:  90 capsule    Refill:  1    Follow-up: Return in about 3 months (around 01/01/2020) for chronic medical conditions.       Hoy RegisterEnobong Jarmon Javid, MD, FAAFP. [facility] [place], [place] [phone]   10/01/2019, 10:00 AM

## 2019-10-01 NOTE — Patient Instructions (Signed)
° °Calorie Counting for Weight Loss °Calories are units of energy. Your body needs a certain amount of calories from food to keep you going throughout the day. When you eat more calories than your body needs, your body stores the extra calories as fat. When you eat fewer calories than your body needs, your body burns fat to get the energy it needs. °Calorie counting means keeping track of how many calories you eat and drink each day. Calorie counting can be helpful if you need to lose weight. If you make sure to eat fewer calories than your body needs, you should lose weight. Ask your health care provider what a healthy weight is for you. °For calorie counting to work, you will need to eat the right number of calories in a day in order to lose a healthy amount of weight per week. A dietitian can help you determine how many calories you need in a day and will give you suggestions on how to reach your calorie goal. °· A healthy amount of weight to lose per week is usually 1-2 lb (0.5-0.9 kg). This usually means that your daily calorie intake should be reduced by 500-750 calories. °· Eating 1,200 - 1,500 calories per day can help most women lose weight. °· Eating 1,500 - 1,800 calories per day can help most men lose weight. °What is my plan? °My goal is to have __________ calories per day. °If I have this many calories per day, I should lose around __________ pounds per week. °What do I need to know about calorie counting? °In order to meet your daily calorie goal, you will need to: °· Find out how many calories are in each food you would like to eat. Try to do this before you eat. °· Decide how much of the food you plan to eat. °· Write down what you ate and how many calories it had. Doing this is called keeping a food log. °To successfully lose weight, it is important to balance calorie counting with a healthy lifestyle that includes regular activity. Aim for 150 minutes of moderate exercise (such as walking) or 75  minutes of vigorous exercise (such as running) each week. °Where do I find calorie information? ° °The number of calories in a food can be found on a Nutrition Facts label. If a food does not have a Nutrition Facts label, try to look up the calories online or ask your dietitian for help. °Remember that calories are listed per serving. If you choose to have more than one serving of a food, you will have to multiply the calories per serving by the amount of servings you plan to eat. For example, the label on a package of bread might say that a serving size is 1 slice and that there are 90 calories in a serving. If you eat 1 slice, you will have eaten 90 calories. If you eat 2 slices, you will have eaten 180 calories. °How do I keep a food log? °Immediately after each meal, record the following information in your food log: °· What you ate. Don't forget to include toppings, sauces, and other extras on the food. °· How much you ate. This can be measured in cups, ounces, or number of items. °· How many calories each food and drink had. °· The total number of calories in the meal. °Keep your food log near you, such as in a small notebook in your pocket, or use a mobile app or website. Some programs will   calculate calories for you and show you how many calories you have left for the day to meet your goal. °What are some calorie counting tips? ° °· Use your calories on foods and drinks that will fill you up and not leave you hungry: °? Some examples of foods that fill you up are nuts and nut butters, vegetables, lean proteins, and high-fiber foods like whole grains. High-fiber foods are foods with more than 5 g fiber per serving. °? Drinks such as sodas, specialty coffee drinks, alcohol, and juices have a lot of calories, yet do not fill you up. °· Eat nutritious foods and avoid empty calories. Empty calories are calories you get from foods or beverages that do not have many vitamins or protein, such as candy, sweets, and  soda. It is better to have a nutritious high-calorie food (such as an avocado) than a food with few nutrients (such as a bag of chips). °· Know how many calories are in the foods you eat most often. This will help you calculate calorie counts faster. °· Pay attention to calories in drinks. Low-calorie drinks include water and unsweetened drinks. °· Pay attention to nutrition labels for "low fat" or "fat free" foods. These foods sometimes have the same amount of calories or more calories than the full fat versions. They also often have added sugar, starch, or salt, to make up for flavor that was removed with the fat. °· Find a way of tracking calories that works for you. Get creative. Try different apps or programs if writing down calories does not work for you. °What are some portion control tips? °· Know how many calories are in a serving. This will help you know how many servings of a certain food you can have. °· Use a measuring cup to measure serving sizes. You could also try weighing out portions on a kitchen scale. With time, you will be able to estimate serving sizes for some foods. °· Take some time to put servings of different foods on your favorite plates, bowls, and cups so you know what a serving looks like. °· Try not to eat straight from a bag or box. Doing this can lead to overeating. Put the amount you would like to eat in a cup or on a plate to make sure you are eating the right portion. °· Use smaller plates, glasses, and bowls to prevent overeating. °· Try not to multitask (for example, watch TV or use your computer) while eating. If it is time to eat, sit down at a table and enjoy your food. This will help you to know when you are full. It will also help you to be aware of what you are eating and how much you are eating. °What are tips for following this plan? °Reading food labels °· Check the calorie count compared to the serving size. The serving size may be smaller than what you are used to  eating. °· Check the source of the calories. Make sure the food you are eating is high in vitamins and protein and low in saturated and trans fats. °Shopping °· Read nutrition labels while you shop. This will help you make healthy decisions before you decide to purchase your food. °· Make a grocery list and stick to it. °Cooking °· Try to cook your favorite foods in a healthier way. For example, try baking instead of frying. °· Use low-fat dairy products. °Meal planning °· Use more fruits and vegetables. Half of your plate should be   fruits and vegetables. °· Include lean proteins like poultry and fish. °How do I count calories when eating out? °· Ask for smaller portion sizes. °· Consider sharing an entree and sides instead of getting your own entree. °· If you get your own entree, eat only half. Ask for a box at the beginning of your meal and put the rest of your entree in it so you are not tempted to eat it. °· If calories are listed on the menu, choose the lower calorie options. °· Choose dishes that include vegetables, fruits, whole grains, low-fat dairy products, and lean protein. °· Choose items that are boiled, broiled, grilled, or steamed. Stay away from items that are buttered, battered, fried, or served with cream sauce. Items labeled "crispy" are usually fried, unless stated otherwise. °· Choose water, low-fat milk, unsweetened iced tea, or other drinks without added sugar. If you want an alcoholic beverage, choose a lower calorie option such as a glass of wine or light beer. °· Ask for dressings, sauces, and syrups on the side. These are usually high in calories, so you should limit the amount you eat. °· If you want a salad, choose a garden salad and ask for grilled meats. Avoid extra toppings like bacon, cheese, or fried items. Ask for the dressing on the side, or ask for olive oil and vinegar or lemon to use as dressing. °· Estimate how many servings of a food you are given. For example, a serving of  cooked rice is ½ cup or about the size of half a baseball. Knowing serving sizes will help you be aware of how much food you are eating at restaurants. The list below tells you how big or small some common portion sizes are based on everyday objects: °? 1 oz--4 stacked dice. °? 3 oz--1 deck of cards. °? 1 tsp--1 die. °? 1 Tbsp--½ a ping-pong ball. °? 2 Tbsp--1 ping-pong ball. °? ½ cup--½ baseball. °? 1 cup--1 baseball. °Summary °· Calorie counting means keeping track of how many calories you eat and drink each day. If you eat fewer calories than your body needs, you should lose weight. °· A healthy amount of weight to lose per week is usually 1-2 lb (0.5-0.9 kg). This usually means reducing your daily calorie intake by 500-750 calories. °· The number of calories in a food can be found on a Nutrition Facts label. If a food does not have a Nutrition Facts label, try to look up the calories online or ask your dietitian for help. °· Use your calories on foods and drinks that will fill you up, and not on foods and drinks that will leave you hungry. °· Use smaller plates, glasses, and bowls to prevent overeating. °This information is not intended to replace advice given to you by your health care provider. Make sure you discuss any questions you have with your health care provider. °Document Released: 11/19/2005 Document Revised: 08/08/2018 Document Reviewed: 10/19/2016 °Elsevier Patient Education © 2020 [employer] ° °

## 2019-10-01 NOTE — Progress Notes (Signed)
Patient is fasting today.  States that FSBS readings have been between 98-130s. Denies nausea, vomiting, polyuria, polydipsia.  Doesn't check BP at home. Denies chest pain, SHOB, headaches, palpitations, dizziness.  Declines flu shot.

## 2019-10-02 ENCOUNTER — Telehealth: Payer: Self-pay

## 2019-10-02 LAB — LIPID PANEL
Chol/HDL Ratio: 4 ratio (ref 0.0–5.0)
Cholesterol, Total: 177 mg/dL (ref 100–199)
HDL: 44 mg/dL (ref >39)
LDL Chol Calc (NIH): 109 mg/dL — ABNORMAL HIGH (ref 0–99)
Triglycerides: 134 mg/dL (ref 0–149)
VLDL Cholesterol Cal: 24 mg/dL (ref 5–40)

## 2019-10-02 LAB — HEMOGLOBIN A1C
Est. average glucose Bld gHb Est-mCnc: 200 mg/dL
Hgb A1c MFr Bld: 8.6 % — ABNORMAL HIGH (ref 4.8–5.6)

## 2019-10-02 LAB — T4, FREE: Free T4: 1.09 ng/dL (ref 0.82–1.77)

## 2019-10-02 LAB — TSH: TSH: 5.14 u[IU]/mL — ABNORMAL HIGH (ref 0.450–4.500)

## 2019-10-02 MED ORDER — LEVOTHYROXINE SODIUM 112 MCG PO TABS: 112.0000 ug | ORAL_TABLET | ORAL | 1 refills | Status: AC

## 2019-10-02 NOTE — Telephone Encounter (Signed)
Patient was called and a voicemail was left informing patient to return phone call for lab results. 

## 2019-10-02 NOTE — Telephone Encounter (Signed)
-----   Message from Charlott Rakes, MD sent at 10/02/2019  8:30 AM EDT ----- Thyroid panel is slightly abnormal and I have sent an increased dose of levothyroxine to his pharmacy.  A1c is 8.1 which is slightly down from 9.1 previously.  Please encourage to work on diabetic diet and lifestyle modifications.  Cholesterol has also improved.

## 2019-10-02 NOTE — Telephone Encounter (Signed)
Patient returned phone call and was informed of lab results. 

## 2019-10-03 ENCOUNTER — Other Ambulatory Visit: Payer: Self-pay | Admitting: Family Medicine

## 2020-01-07 ENCOUNTER — Ambulatory Visit: Payer: Managed Care, Other (non HMO) | Admitting: Internal Medicine

## 2020-02-13 ENCOUNTER — Other Ambulatory Visit: Payer: Self-pay | Admitting: Surgical Oncology

## 2020-02-13 DIAGNOSIS — K219 Gastro-esophageal reflux disease without esophagitis: Secondary | ICD-10-CM

## 2020-02-24 ENCOUNTER — Other Ambulatory Visit: Payer: Managed Care, Other (non HMO)

## 2020-03-10 ENCOUNTER — Encounter (HOSPITAL_COMMUNITY): Payer: Self-pay

## 2020-03-10 ENCOUNTER — Emergency Department (HOSPITAL_COMMUNITY): Payer: Managed Care, Other (non HMO)

## 2020-03-10 ENCOUNTER — Inpatient Hospital Stay (HOSPITAL_COMMUNITY)
Admission: EM | Admit: 2020-03-10 | Discharge: 2020-03-17 | DRG: 177 | Disposition: A | Discharge status: Home / Self Care | Payer: Managed Care, Other (non HMO) | Attending: Internal Medicine | Admitting: Internal Medicine

## 2020-03-10 ENCOUNTER — Other Ambulatory Visit: Payer: Self-pay

## 2020-03-10 DIAGNOSIS — Z87891 Personal history of nicotine dependence: Secondary | ICD-10-CM | POA: Diagnosis not present

## 2020-03-10 DIAGNOSIS — I10 Essential (primary) hypertension: Secondary | ICD-10-CM | POA: Diagnosis present

## 2020-03-10 DIAGNOSIS — E785 Hyperlipidemia, unspecified: Secondary | ICD-10-CM | POA: Diagnosis present

## 2020-03-10 DIAGNOSIS — Z79890 Hormone replacement therapy (postmenopausal): Secondary | ICD-10-CM | POA: Diagnosis not present

## 2020-03-10 DIAGNOSIS — T380X5A Adverse effect of glucocorticoids and synthetic analogues, initial encounter: Secondary | ICD-10-CM | POA: Diagnosis present

## 2020-03-10 DIAGNOSIS — J9601 Acute respiratory failure with hypoxia: Secondary | ICD-10-CM | POA: Diagnosis present

## 2020-03-10 DIAGNOSIS — F419 Anxiety disorder, unspecified: Secondary | ICD-10-CM | POA: Diagnosis present

## 2020-03-10 DIAGNOSIS — J1282 Pneumonia due to coronavirus disease 2019: Secondary | ICD-10-CM | POA: Diagnosis present

## 2020-03-10 DIAGNOSIS — G4733 Obstructive sleep apnea (adult) (pediatric): Secondary | ICD-10-CM | POA: Diagnosis present

## 2020-03-10 DIAGNOSIS — Z6836 Body mass index (BMI) 36.0-36.9, adult: Secondary | ICD-10-CM | POA: Diagnosis not present

## 2020-03-10 DIAGNOSIS — E1165 Type 2 diabetes mellitus with hyperglycemia: Secondary | ICD-10-CM | POA: Diagnosis present

## 2020-03-10 DIAGNOSIS — J9600 Acute respiratory failure, unspecified whether with hypoxia or hypercapnia: Secondary | ICD-10-CM

## 2020-03-10 DIAGNOSIS — Z833 Family history of diabetes mellitus: Secondary | ICD-10-CM | POA: Diagnosis not present

## 2020-03-10 DIAGNOSIS — E669 Obesity, unspecified: Secondary | ICD-10-CM | POA: Diagnosis present

## 2020-03-10 DIAGNOSIS — Z8249 Family history of ischemic heart disease and other diseases of the circulatory system: Secondary | ICD-10-CM

## 2020-03-10 DIAGNOSIS — Z794 Long term (current) use of insulin: Secondary | ICD-10-CM | POA: Diagnosis not present

## 2020-03-10 DIAGNOSIS — N179 Acute kidney failure, unspecified: Secondary | ICD-10-CM | POA: Diagnosis not present

## 2020-03-10 DIAGNOSIS — N529 Male erectile dysfunction, unspecified: Secondary | ICD-10-CM | POA: Diagnosis present

## 2020-03-10 DIAGNOSIS — G5601 Carpal tunnel syndrome, right upper limb: Secondary | ICD-10-CM | POA: Diagnosis present

## 2020-03-10 DIAGNOSIS — E039 Hypothyroidism, unspecified: Secondary | ICD-10-CM | POA: Diagnosis present

## 2020-03-10 DIAGNOSIS — Z7982 Long term (current) use of aspirin: Secondary | ICD-10-CM | POA: Diagnosis not present

## 2020-03-10 DIAGNOSIS — U071 COVID-19: Secondary | ICD-10-CM

## 2020-03-10 DIAGNOSIS — E11649 Type 2 diabetes mellitus with hypoglycemia without coma: Secondary | ICD-10-CM | POA: Diagnosis not present

## 2020-03-10 DIAGNOSIS — Z79899 Other long term (current) drug therapy: Secondary | ICD-10-CM | POA: Diagnosis not present

## 2020-03-10 LAB — FERRITIN: Ferritin: 642 ng/mL — ABNORMAL HIGH (ref 24–336)

## 2020-03-10 LAB — CBC WITH DIFFERENTIAL/PLATELET
Abs Immature Granulocytes: 0.02 10*3/uL (ref 0.00–0.07)
Basophils Absolute: 0 10*3/uL (ref 0.0–0.1)
Basophils Relative: 0 %
Eosinophils Absolute: 0 10*3/uL (ref 0.0–0.5)
Eosinophils Relative: 0 %
HCT: 42.8 % (ref 39.0–52.0)
Hemoglobin: 14 g/dL (ref 13.0–17.0)
Immature Granulocytes: 0 %
Lymphocytes Relative: 26 %
Lymphs Abs: 1.5 10*3/uL (ref 0.7–4.0)
MCH: 29 pg (ref 26.0–34.0)
MCHC: 32.7 g/dL (ref 30.0–36.0)
MCV: 88.6 fL (ref 80.0–100.0)
Monocytes Absolute: 0.3 10*3/uL (ref 0.1–1.0)
Monocytes Relative: 6 %
Neutro Abs: 4 10*3/uL (ref 1.7–7.7)
Neutrophils Relative %: 68 %
Platelets: 204 10*3/uL (ref 150–400)
RBC: 4.83 MIL/uL (ref 4.22–5.81)
RDW: 14.5 % (ref 11.5–15.5)
WBC: 5.8 10*3/uL (ref 4.0–10.5)
nRBC: 0 % (ref 0.0–0.2)

## 2020-03-10 LAB — PROCALCITONIN: Procalcitonin: <0.1 ng/mL

## 2020-03-10 LAB — COMPREHENSIVE METABOLIC PANEL
ALT: 26 U/L (ref 0–44)
AST: 29 U/L (ref 15–41)
Albumin: 3.1 g/dL — ABNORMAL LOW (ref 3.5–5.0)
Alkaline Phosphatase: 58 U/L (ref 38–126)
Anion gap: 13 (ref 5–15)
BUN: 16 mg/dL (ref 6–20)
CO2: 24 mmol/L (ref 22–32)
Calcium: 8.2 mg/dL — ABNORMAL LOW (ref 8.9–10.3)
Chloride: 102 mmol/L (ref 98–111)
Creatinine, Ser: 1.25 mg/dL — ABNORMAL HIGH (ref 0.61–1.24)
GFR calc Af Amer: >60 mL/min (ref >60)
GFR calc non Af Amer: >60 mL/min (ref >60)
Glucose, Bld: 146 mg/dL — ABNORMAL HIGH (ref 70–99)
Potassium: 3.5 mmol/L (ref 3.5–5.1)
Sodium: 139 mmol/L (ref 135–145)
Total Bilirubin: 0.8 mg/dL (ref 0.3–1.2)
Total Protein: 6.7 g/dL (ref 6.5–8.1)

## 2020-03-10 LAB — C-REACTIVE PROTEIN: CRP: 26.6 mg/dL — ABNORMAL HIGH (ref <1.0)

## 2020-03-10 LAB — D-DIMER, QUANTITATIVE: D-Dimer, Quant: 0.51 ug/mL-FEU — ABNORMAL HIGH (ref 0.00–0.50)

## 2020-03-10 LAB — GLUCOSE, CAPILLARY
Glucose-Capillary: 185 mg/dL — ABNORMAL HIGH (ref 70–99)
Glucose-Capillary: 256 mg/dL — ABNORMAL HIGH (ref 70–99)

## 2020-03-10 LAB — TSH: TSH: 0.551 u[IU]/mL (ref 0.350–4.500)

## 2020-03-10 LAB — LACTATE DEHYDROGENASE: LDH: 262 U/L — ABNORMAL HIGH (ref 98–192)

## 2020-03-10 LAB — LACTIC ACID, PLASMA: Lactic Acid, Venous: 0.9 mmol/L (ref 0.5–1.9)

## 2020-03-10 LAB — FIBRINOGEN: Fibrinogen: 690 mg/dL — ABNORMAL HIGH (ref 210–475)

## 2020-03-10 LAB — TRIGLYCERIDES: Triglycerides: 132 mg/dL (ref <150)

## 2020-03-10 LAB — ABO/RH: ABO/RH(D): AB POS

## 2020-03-10 LAB — HIV ANTIBODY (ROUTINE TESTING W REFLEX): HIV Screen 4th Generation wRfx: NONREACTIVE

## 2020-03-10 LAB — POC SARS CORONAVIRUS 2 AG -  ED: SARS Coronavirus 2 Ag: POSITIVE — AB

## 2020-03-10 MED ORDER — ONDANSETRON HCL 4 MG/2ML IJ SOLN: 4.0000 mg | Freq: Four times a day (QID) | INTRAMUSCULAR | Status: DC | PRN

## 2020-03-10 MED ORDER — DEXAMETHASONE 6 MG PO TABS
6.0000 mg | ORAL_TABLET | ORAL | Status: DC
  Administered 2020-03-11 – 2020-03-17 (×7): 6 mg via ORAL
  Filled 2020-03-10 (×7): qty 1

## 2020-03-10 MED ORDER — INSULIN ASPART 100 UNIT/ML ~~LOC~~ SOLN
0.0000 [IU] | Freq: Every day | SUBCUTANEOUS | Status: DC
  Administered 2020-03-10 – 2020-03-16 (×4): 2 [IU] via SUBCUTANEOUS

## 2020-03-10 MED ORDER — METOPROLOL SUCCINATE ER 25 MG PO TB24
25.0000 mg | ORAL_TABLET | Freq: Every day | ORAL | Status: DC
  Administered 2020-03-11 – 2020-03-17 (×7): 25 mg via ORAL
  Filled 2020-03-10 (×7): qty 1

## 2020-03-10 MED ORDER — HYDROCOD POLST-CPM POLST ER 10-8 MG/5ML PO SUER: 5.0000 mL | Freq: Two times a day (BID) | ORAL | Status: DC | PRN

## 2020-03-10 MED ORDER — ATORVASTATIN CALCIUM 10 MG PO TABS
20.0000 mg | ORAL_TABLET | Freq: Every day | ORAL | Status: DC
  Administered 2020-03-10 – 2020-03-17 (×8): 20 mg via ORAL
  Filled 2020-03-10 (×8): qty 2

## 2020-03-10 MED ORDER — SODIUM CHLORIDE 0.9 % IV SOLN
200.0000 mg | Freq: Once | INTRAVENOUS | Status: AC
  Administered 2020-03-10: 200 mg via INTRAVENOUS
  Filled 2020-03-10: qty 40

## 2020-03-10 MED ORDER — LEVOTHYROXINE SODIUM 112 MCG PO TABS
112.0000 ug | ORAL_TABLET | Freq: Every day | ORAL | Status: DC
  Administered 2020-03-11 – 2020-03-17 (×7): 112 ug via ORAL
  Filled 2020-03-10 (×7): qty 1

## 2020-03-10 MED ORDER — PANTOPRAZOLE SODIUM 40 MG PO TBEC
40.0000 mg | DELAYED_RELEASE_TABLET | Freq: Every day | ORAL | Status: DC
  Administered 2020-03-10 – 2020-03-17 (×8): 40 mg via ORAL
  Filled 2020-03-10 (×8): qty 1

## 2020-03-10 MED ORDER — ENOXAPARIN SODIUM 40 MG/0.4ML ~~LOC~~ SOLN
40.0000 mg | SUBCUTANEOUS | Status: DC
  Administered 2020-03-10 – 2020-03-16 (×7): 40 mg via SUBCUTANEOUS
  Filled 2020-03-10 (×6): qty 0.4

## 2020-03-10 MED ORDER — SENNOSIDES-DOCUSATE SODIUM 8.6-50 MG PO TABS
1.0000 | ORAL_TABLET | Freq: Every evening | ORAL | Status: DC | PRN
  Administered 2020-03-14: 1 via ORAL
  Filled 2020-03-10: qty 1

## 2020-03-10 MED ORDER — ONDANSETRON HCL 4 MG PO TABS: 4.0000 mg | ORAL_TABLET | Freq: Four times a day (QID) | ORAL | Status: DC | PRN

## 2020-03-10 MED ORDER — INSULIN GLARGINE 100 UNIT/ML ~~LOC~~ SOLN
40.0000 [IU] | Freq: Every day | SUBCUTANEOUS | Status: DC
  Administered 2020-03-10: 40 [IU] via SUBCUTANEOUS
  Filled 2020-03-10 (×2): qty 0.4

## 2020-03-10 MED ORDER — SODIUM CHLORIDE 0.9 % IV SOLN
100.0000 mg | Freq: Every day | INTRAVENOUS | Status: AC
  Administered 2020-03-11 – 2020-03-14 (×4): 100 mg via INTRAVENOUS
  Filled 2020-03-10 (×4): qty 20

## 2020-03-10 MED ORDER — DEXAMETHASONE 4 MG PO TABS: 6.0000 mg | ORAL_TABLET | ORAL | Status: DC

## 2020-03-10 MED ORDER — DEXAMETHASONE SODIUM PHOSPHATE 10 MG/ML IJ SOLN
10.0000 mg | Freq: Once | INTRAMUSCULAR | Status: AC
  Administered 2020-03-10: 10 mg via INTRAVENOUS
  Filled 2020-03-10: qty 1

## 2020-03-10 MED ORDER — ASPIRIN EC 81 MG PO TBEC
81.0000 mg | DELAYED_RELEASE_TABLET | Freq: Every day | ORAL | Status: DC
  Administered 2020-03-10 – 2020-03-17 (×8): 81 mg via ORAL
  Filled 2020-03-10 (×8): qty 1

## 2020-03-10 MED ORDER — ACETAMINOPHEN 325 MG PO TABS: 650.0000 mg | ORAL_TABLET | Freq: Four times a day (QID) | ORAL | Status: DC | PRN

## 2020-03-10 MED ORDER — INSULIN ASPART 100 UNIT/ML ~~LOC~~ SOLN
0.0000 [IU] | Freq: Three times a day (TID) | SUBCUTANEOUS | Status: DC
  Administered 2020-03-10: 4 [IU] via SUBCUTANEOUS
  Administered 2020-03-11 (×3): 7 [IU] via SUBCUTANEOUS
  Administered 2020-03-12: 4 [IU] via SUBCUTANEOUS
  Administered 2020-03-13 (×2): 3 [IU] via SUBCUTANEOUS
  Administered 2020-03-14: 7 [IU] via SUBCUTANEOUS
  Administered 2020-03-15: 3 [IU] via SUBCUTANEOUS
  Administered 2020-03-15: 7 [IU] via SUBCUTANEOUS
  Administered 2020-03-16: 4 [IU] via SUBCUTANEOUS
  Administered 2020-03-16: 7 [IU] via SUBCUTANEOUS

## 2020-03-10 MED ORDER — INSULIN GLARGINE 100 UNIT/ML ~~LOC~~ SOLN: 30.0000 [IU] | Freq: Every day | SUBCUTANEOUS | Status: DC

## 2020-03-10 MED ORDER — BUPROPION HCL ER (SR) 100 MG PO TB12
200.0000 mg | ORAL_TABLET | Freq: Two times a day (BID) | ORAL | Status: DC
  Administered 2020-03-10 – 2020-03-17 (×15): 200 mg via ORAL
  Filled 2020-03-10 (×15): qty 2

## 2020-03-10 MED ORDER — AMLODIPINE BESYLATE 5 MG PO TABS
10.0000 mg | ORAL_TABLET | Freq: Every day | ORAL | Status: DC
  Administered 2020-03-10 – 2020-03-16 (×7): 10 mg via ORAL
  Filled 2020-03-10 (×7): qty 2

## 2020-03-10 MED ORDER — SODIUM CHLORIDE 0.9 % IV SOLN
1.0000 g | Freq: Once | INTRAVENOUS | Status: DC
  Administered 2020-03-10: 1 g via INTRAVENOUS
  Filled 2020-03-10: qty 10

## 2020-03-10 MED ORDER — SODIUM CHLORIDE 0.9 % IV BOLUS
500.0000 mL | Freq: Once | INTRAVENOUS | Status: AC
  Administered 2020-03-10: 500 mL via INTRAVENOUS

## 2020-03-10 MED ORDER — SODIUM CHLORIDE 0.9 % IV SOLN
500.0000 mg | Freq: Once | INTRAVENOUS | Status: DC
  Administered 2020-03-10: 500 mg via INTRAVENOUS
  Filled 2020-03-10: qty 500

## 2020-03-10 NOTE — H&P (Signed)
Date: 03/10/2020               Patient Name:  Danny Macdonald MRN: [id number]  DOB: January 05, 1971 Age / Sex: 49 y.o., male   PCP: Arneta Cliche         Medical Service: Internal Medicine Teaching Service         Attending Physician: Dr. Aldine Contes, MD    First Contact: Dr. Tamsen Snider Pager: [phone]  Second Contact: Dr. Gilberto Better Pager: [phone]       After Hours (After 5p/  First Contact Pager: [phone]  weekends / holidays): Second Contact Pager: [phone]   Chief Complaint: Shortness of breath  History of Present Illness:  [name] is a 49 yo M w/ PMH of hypothyroidism, OSA, T2DM and HTN who presents with complaint of shortness of breath. He states he was in his usual state of health until last Sunday when he started taking the bus because his car broke down. He mentions that over the course of 3 days he began to develop symptoms of nasal congestion, subjective fever and on Wednesday (03/09/20) developed shortness of breath requiring him to rest after walking short distances. He came to the ED for evaluation due to this new dyspnea. He mentions that his wife has been vaccinated for [place]. His children were tested recently and found to be negative.  On ROS, he continues to endorse sinus congestion, intermittent occasional chest discomfort associated with non-productive cough. Denies any nausea, vomiting, diarrhea, constipation, focal weakness, dysuria.  In the ED, he was found to have desaturation events down to mid 80s and had progressively increasing oxygen requirement currently on 10L HFNC. His Chest X-ray showed bilateral opacities. COVID test was positive. IMTS was consulted for admission.  Meds: Current Meds  Medication Sig  . amLODipine (NORVASC) 10 MG tablet Take 1 tablet (10 mg total) by mouth at bedtime.  Marland Kitchen aspirin EC 81 MG tablet Take 1 tablet by mouth daily.  Marland Kitchen atorvastatin (LIPITOR) 20 MG tablet Take 1 tablet (20 mg total) by mouth daily.  . canagliflozin  (INVOKANA) 100 MG TABS tablet Take 1 tablet (100 mg total) by mouth daily.  Marland Kitchen glipiZIDE (GLUCOTROL) 5 MG tablet Take 1 tablet (5 mg total) by mouth daily. WITH MEAL  . Insulin Detemir (LEVEMIR FLEXTOUCH) 100 UNIT/ML Pen Inject 50 Units into the skin 2 (two) times daily. Hold dose for BS <150.  Marland Kitchen levothyroxine (SYNTHROID) 112 MCG tablet Take 1 tablet (112 mcg total) by mouth every morning.  Marland Kitchen losartan-hydrochlorothiazide (HYZAAR) 100-12.5 MG tablet Take 1 tablet by mouth daily.  . metFORMIN (GLUCOPHAGE) 1000 MG tablet Take 1 tablet (1,000 mg total) by mouth 2 (two) times daily with a meal.  . metoprolol succinate (TOPROL-XL) 25 MG 24 hr tablet Take 1 tablet (25 mg total) by mouth daily.  Marland Kitchen omeprazole (PRILOSEC) 40 MG capsule Take 1 capsule (40 mg total) by mouth daily.  . Semaglutide,0.25 or 0.5MG /DOS, (OZEMPIC, 0.25 OR 0.5 MG/DOSE,) 2 MG/1.5ML SOPN Inject 0.5 mg into the skin once a week. Wednesday  . sildenafil (VIAGRA) 100 MG tablet Take 1 tablet (100 mg total) by mouth as needed for erectile dysfunction.   Allergies: Allergies as of 03/10/2020  . (No Known Allergies)   Past Medical History:  Diagnosis Date  . Anxiety   . Carpal tunnel syndrome, right   . Erectile dysfunction   . Essential hypertension   . Hyperlipidemia   . Hypothyroidism   . Left ventricular dysfunction   .  OSA (obstructive sleep apnea)   . Uncontrolled type 2 diabetes mellitus with hyperglycemia (HCC)     Family History: Multiple members of family have diabetes. No early episodes of heart attack or stroke.  Social History: Lives with fiance and step-son/daughter. Works at [employer]. Occasional alcohol use (1-2 drinks per month). Has about 3 pack-year smoking history but quit 7 years ago. Denies any illicit substance use.  Review of Systems: A complete ROS was negative except as per HPI.   Physical Exam: Blood pressure (!) 126/93, pulse 99, temperature 99.4 F (37.4 C), temperature  source Oral, resp. rate 15, height 5\' 1"  (1.549 m), weight 88.5 kg, SpO2 (!) 88 %.  Gen: Well-developed, well nourished, NAD HEENT: NCAT head, hearing intact, Skin tag lateral to R eye, EOMI, Sinus congestion, MMM Neck: supple, ROM intact, wide circumference, no JVD, no cervical adenopathy CV: Tachycardic, S1, S2 normal, No rubs, no murmurs, no gallops Pulm: Bibasilar crackles, no wheezes Abd: Soft, BS+, NTND Extm: ROM intact, Peripheral pulses intact, No peripheral edema Skin: Dry, Warm, normal turgor, no rashes, lesions, wounds.  Neuro: AAOx3  EKG: personally reviewed my interpretation is normal sinus, no ST changes, no prior EKG to compare  CXR: personally reviewed my interpretation is poor inspiratory effort, low volume, no lobar consolidation, no pleural effusion, no pulmonary edema  Assessment & Plan by Problem: Active Problems:   Acute hypoxemic respiratory failure due to COVID-19 [facility])  [name] is a 49 yo M w/ PMH of hypothyroidism, OSA, T2DM and HTN admit for COVID penumonia.  Acute hypoxic respiratory failure COVID pneumonia Presenting w/ dyspnea, fever (101F), sinus congestion. New O2 requirement. Currently 90 on 10L HF [place]. COVID positive. Started on ceftriaxone, azithromycin in ED but pro-calcitonin negative. LFTS normal - D/c ceftriaxone, azithromycin - Start remdesivir, decadron - O2 as needed - Trend inflammatory markers, LFTs  OSA Has hx of OSA w/ CPAP use at home. Unable to provide CPAP on telemetry bed in 5w. - Monitor  T2DM On levemir 50 units, metformin, victoza, invokana. Cbg ~140s. Likely will have increasing requirement with steroids - Lantus 40 units - SSI - Glucose checks. - Hgb a1c  Hypothyroidism On levothyroxine 57. Unknown cause of hypothyroidism. - Check TSH - C/w home meds: levothyroxine 112 mcg  HTN On amlodipine, losartan, HCTZ at home. Mild bump in creatinine on admission - C/w amlodipine - Hold losartan-HCTZ  HLD - C/w  atorvastatin 20mg  daily  DVT proph: Lovenox Diet: CM, HH Gastric: pantoprazole Bowel: senokot Code: Full  Dispo: Admit patient to Inpatient with expected length of stay greater than 2 midnights.  Signed: , MD 03/10/2020, 3:39 PM  Pager: [phone]

## 2020-03-10 NOTE — ED Triage Notes (Signed)
Patient complains of 1 day of fever with cough and chills and reports SOB. Patient alert and oriented, NAD

## 2020-03-10 NOTE — ED Provider Notes (Signed)
[facility] EMERGENCY DEPARTMENT Provider Note   CSN: [id number] Arrival date & time: 03/10/20  1043     History Chief Complaint  Patient presents with  . fever/ cough/ SOB    Danny Macdonald is a 49 y.o. male.  Pt presents to the ED today with sob and cough and fever.  Pt said sx started yesterday.  Today, he developed a fever of 101.4.  He took an ibuprofen prior to coming in.  No known covid contacts.  He has not had any of the covid vaccine.  Upon arrival to the ED, his O2 sat was 89% on RA.  He is not normally on oxygen.  He was placed on 3L and O2 up to 92%.         Past Medical History:  Diagnosis Date  . Anxiety   . Carpal tunnel syndrome, right   . Erectile dysfunction   . Essential hypertension   . Hyperlipidemia   . Hypothyroidism   . Left ventricular dysfunction   . OSA (obstructive sleep apnea)   . Uncontrolled type 2 diabetes mellitus with hyperglycemia [facility])     Patient Active Problem List   Diagnosis Date Noted  . BMI 37.0-37.9, adult 02/20/2018  . Left ventricular dysfunction 01/23/2018  . Anxiety disorder 10/14/2017  . Carpal tunnel syndrome of right wrist 10/14/2017  . Essential (primary) hypertension 10/14/2017  . Hyperlipidemia 10/14/2017  . Hypothyroidism 10/14/2017  . Male erectile dysfunction, unspecified 10/14/2017  . Nasal septal deviation 07/11/2016  . Anosmia 01/17/2016    History reviewed. No pertinent surgical history.     Family History  Problem Relation Age of Onset  . Arthritis Mother   . Diabetes Mother   . Hypertension Mother   . Hypertension Father   . Hypertension Sister   . Hypertension Brother   . Diabetes Maternal Grandmother   . Hypertension Maternal Grandmother   . Diabetes Maternal Grandfather   . Hypertension Maternal Grandfather   . Hypertension Paternal Grandmother   . Hypertension Paternal Grandfather   . Diabetes Child     Social History   Tobacco Use  . Smoking status: Former  Smoker    Quit date: 10/26/2013    Years since quitting: 6.3  . Smokeless tobacco: Never Used  Substance Use Topics  . Alcohol use: Yes    Comment: ocassionally  . Drug use: No    Home Medications Prior to Admission medications   Medication Sig Start Date End Date Taking? Authorizing Provider  amLODipine (NORVASC) 10 MG tablet Take 1 tablet (10 mg total) by mouth at bedtime. 10/01/19   Hoy Register, MD  aspirin EC 81 MG tablet Take 1 tablet by mouth daily. 11/11/18   [provider]  atorvastatin (LIPITOR) 20 MG tablet Take 1 tablet (20 mg total) by mouth daily. 10/01/19   Hoy Register, MD  buPROPion (WELLBUTRIN SR) 200 MG 12 hr tablet Take 1 tablet (200 mg total) by mouth 2 (two) times daily. 10/01/19   Hoy Register, MD  canagliflozin (INVOKANA) 100 MG TABS tablet Take 1 tablet (100 mg total) by mouth daily. 10/01/19   Hoy Register, MD  glipiZIDE (GLUCOTROL) 5 MG tablet Take 1 tablet (5 mg total) by mouth daily. WITH MEAL 10/01/19   Hoy Register, MD  Insulin Detemir (LEVEMIR FLEXTOUCH) 100 UNIT/ML Pen Inject 50 Units into the skin 2 (two) times daily. Hold dose for BS <150. 10/01/19   Hoy Register, MD  levothyroxine (SYNTHROID) 112 MCG tablet Take 1 tablet (  112 mcg total) by mouth every morning. 10/02/19   Charlott Rakes, MD  losartan-hydrochlorothiazide (HYZAAR) 100-12.5 MG tablet Take 1 tablet by mouth daily. 10/01/19   Charlott Rakes, MD  metFORMIN (GLUCOPHAGE) 1000 MG tablet Take 1 tablet (1,000 mg total) by mouth 2 (two) times daily with a meal. 10/01/19   Charlott Rakes, MD  metoprolol succinate (TOPROL-XL) 25 MG 24 hr tablet Take 1 tablet (25 mg total) by mouth daily. 10/01/19   Charlott Rakes, MD  omeprazole (PRILOSEC) 40 MG capsule Take 1 capsule (40 mg total) by mouth daily. 10/01/19   Charlott Rakes, MD  sildenafil (VIAGRA) 100 MG tablet Take 1 tablet (100 mg total) by mouth as needed for erectile dysfunction. 04/29/19   Scot Jun, FNP     Allergies    Patient has no known allergies.  Review of Systems   Review of Systems  Constitutional: Positive for fever.  Respiratory: Positive for cough and shortness of breath.   All other systems reviewed and are negative.   Physical Exam Updated Vital Signs BP 120/76   Pulse 91   Temp 99.4 F (37.4 C) (Oral)   Resp 20   Ht 5\' 1"  (1.549 m)   Wt 88.5 kg   SpO2 92%   BMI 36.84 kg/m   Physical Exam Vitals and nursing note reviewed.  Constitutional:      Appearance: Normal appearance. He is obese.  HENT:     Head: Normocephalic and atraumatic.     Right Ear: External ear normal.     Left Ear: External ear normal.     Nose: Nose normal.     Mouth/Throat:     Mouth: Mucous membranes are moist.     Pharynx: Oropharynx is clear.  Eyes:     Extraocular Movements: Extraocular movements intact.     Conjunctiva/sclera: Conjunctivae normal.     Pupils: Pupils are equal, round, and reactive to light.  Cardiovascular:     Rate and Rhythm: Regular rhythm. Bradycardia present.  Pulmonary:     Effort: Tachypnea present.     Breath sounds: Normal breath sounds.  Abdominal:     General: Abdomen is flat. Bowel sounds are normal.     Palpations: Abdomen is soft.  Musculoskeletal:        General: Normal range of motion.     Cervical back: Normal range of motion and neck supple.  Skin:    General: Skin is warm.     Capillary Refill: Capillary refill takes less than 2 seconds.  Neurological:     General: No focal deficit present.     Mental Status: He is alert and oriented to person, place, and time.  Psychiatric:        Mood and Affect: Mood normal.        Behavior: Behavior normal.        Thought Content: Thought content normal.        Judgment: Judgment normal.     ED Results / Procedures / Treatments   Labs (all labs ordered are listed, but only abnormal results are displayed) Labs Reviewed  COMPREHENSIVE METABOLIC PANEL - Abnormal; Notable for the following  components:      Result Value   Glucose, Bld 146 (*)    Creatinine, Ser 1.25 (*)    Calcium 8.2 (*)    Albumin 3.1 (*)    All other components within normal limits  D-DIMER, QUANTITATIVE (NOT AT [facility]) - Abnormal; Notable for the following components:   D-Dimer,  Quant 0.51 (*)    All other components within normal limits  LACTATE DEHYDROGENASE - Abnormal; Notable for the following components:   LDH 262 (*)    All other components within normal limits  FERRITIN - Abnormal; Notable for the following components:   Ferritin 642 (*)    All other components within normal limits  FIBRINOGEN - Abnormal; Notable for the following components:   Fibrinogen 690 (*)    All other components within normal limits  C-REACTIVE PROTEIN - Abnormal; Notable for the following components:   CRP 26.6 (*)    All other components within normal limits  POC SARS CORONAVIRUS 2 AG -  ED - Abnormal; Notable for the following components:   SARS Coronavirus 2 Ag POSITIVE (*)    All other components within normal limits  CULTURE, BLOOD (ROUTINE X 2)  CULTURE, BLOOD (ROUTINE X 2)  LACTIC ACID, PLASMA  PROCALCITONIN  TRIGLYCERIDES  CBC WITH DIFFERENTIAL/PLATELET    EKG EKG Interpretation  Date/Time:  Thursday March 10 2020 11:41:04 EDT Ventricular Rate:  95 PR Interval:    QRS Duration: 87 QT Interval:  377 QTC Calculation: 474 R Axis:   143 Text Interpretation: Sinus rhythm Consider right atrial enlargement Right axis deviation No old tracing to compare Confirmed by Jacalyn Lefevre [phone]) on 03/10/2020 12:17:18 PM   Radiology DG Chest 2 View  Result Date: 03/10/2020 CLINICAL DATA:  Cough with fever EXAM: CHEST - 2 VIEW COMPARISON:  None. FINDINGS: Low volume chest with streaky opacity at the bases. Normal heart size for technique. No visible effusion or pneumothorax. IMPRESSION: Low volume chest with streaky opacity at the bases, suspect atypical pneumonia. Electronically Signed   By: Marnee Spring M.D.    On: 03/10/2020 11:27    Procedures Procedures (including critical care time)  Medications Ordered in ED Medications  cefTRIAXone (ROCEPHIN) 1 g in sodium chloride 0.9 % 100 mL IVPB (has no administration in time range)  azithromycin (ZITHROMAX) 500 mg in sodium chloride 0.9 % 250 mL IVPB (has no administration in time range)  remdesivir 200 mg in sodium chloride 0.9% 250 mL IVPB (has no administration in time range)    Followed by  remdesivir 100 mg in sodium chloride 0.9 % 100 mL IVPB (has no administration in time range)  dexamethasone (DECADRON) injection 10 mg (10 mg Intravenous Given 03/10/20 1244)    ED Course  I have reviewed the triage vital signs and the nursing notes.  Pertinent labs & imaging results that were available during my care of the patient were reviewed by me and considered in my medical decision making (see chart for details).    MDM Rules/Calculators/A&P                      Initially, pt's O2 was ok with 3L, but it kept dropping to the upper 80s despite increasing it to 6L.  High flow O2 ordered.  Decadron, rocephin, zithromax, remdesivir ordered.  Pt d/w IMTS for admission.  Siddhant Hashemi was evaluated in Emergency Department on 03/10/2020 for the symptoms described in the history of present illness. He was evaluated in the context of the global COVID-19 pandemic, which necessitated consideration that the patient might be at risk for infection with the SARS-CoV-2 virus that causes COVID-19. Institutional protocols and algorithms that pertain to the evaluation of patients at risk for COVID-19 are in a state of rapid change based on information released by regulatory bodies including the CDC and federal and  state organizations. These policies and algorithms were followed during the patient's care in the ED.  CRITICAL CARE Performed by: Jacalyn Lefevre   Total critical care time: 30 minutes  Critical care time was exclusive of separately billable procedures  and treating other patients.  Critical care was necessary to treat or prevent imminent or life-threatening deterioration.  Critical care was time spent personally by me on the following activities: development of treatment plan with patient and/or surrogate as well as nursing, discussions with consultants, evaluation of patient's response to treatment, examination of patient, obtaining history from patient or surrogate, ordering and performing treatments and interventions, ordering and review of laboratory studies, ordering and review of radiographic studies, pulse oximetry and re-evaluation of patient's condition. Final Clinical Impression(s) / ED Diagnoses Final diagnoses:  Pneumonia due to COVID-19 virus  Acute respiratory failure due to COVID-19 [facility])    Rx / DC Orders ED Discharge Orders    None       Jacalyn Lefevre, MD 03/10/20 1350

## 2020-03-11 LAB — COMPREHENSIVE METABOLIC PANEL
ALT: 25 U/L (ref 0–44)
AST: 28 U/L (ref 15–41)
Albumin: 2.9 g/dL — ABNORMAL LOW (ref 3.5–5.0)
Alkaline Phosphatase: 59 U/L (ref 38–126)
Anion gap: 14 (ref 5–15)
BUN: 21 mg/dL — ABNORMAL HIGH (ref 6–20)
CO2: 22 mmol/L (ref 22–32)
Calcium: 8.5 mg/dL — ABNORMAL LOW (ref 8.9–10.3)
Chloride: 102 mmol/L (ref 98–111)
Creatinine, Ser: 1.11 mg/dL (ref 0.61–1.24)
GFR calc Af Amer: >60 mL/min (ref >60)
GFR calc non Af Amer: >60 mL/min (ref >60)
Glucose, Bld: 223 mg/dL — ABNORMAL HIGH (ref 70–99)
Potassium: 4.1 mmol/L (ref 3.5–5.1)
Sodium: 138 mmol/L (ref 135–145)
Total Bilirubin: 0.5 mg/dL (ref 0.3–1.2)
Total Protein: 6.9 g/dL (ref 6.5–8.1)

## 2020-03-11 LAB — BLOOD GAS, ARTERIAL
Acid-base deficit: 0.3 mmol/L (ref 0.0–2.0)
Bicarbonate: 23.7 mmol/L (ref 20.0–28.0)
Drawn by: 275531
FIO2: 100
O2 Saturation: 96.8 %
Patient temperature: 37
pCO2 arterial: 38.1 mmHg (ref 32.0–48.0)
pH, Arterial: 7.41 (ref 7.350–7.450)
pO2, Arterial: 96.4 mmHg (ref 83.0–108.0)

## 2020-03-11 LAB — CBC WITH DIFFERENTIAL/PLATELET
Abs Immature Granulocytes: 0.02 10*3/uL (ref 0.00–0.07)
Basophils Absolute: 0 10*3/uL (ref 0.0–0.1)
Basophils Relative: 0 %
Eosinophils Absolute: 0 10*3/uL (ref 0.0–0.5)
Eosinophils Relative: 0 %
HCT: 45.7 % (ref 39.0–52.0)
Hemoglobin: 15.2 g/dL (ref 13.0–17.0)
Immature Granulocytes: 0 %
Lymphocytes Relative: 16 %
Lymphs Abs: 0.8 10*3/uL (ref 0.7–4.0)
MCH: 29.2 pg (ref 26.0–34.0)
MCHC: 33.3 g/dL (ref 30.0–36.0)
MCV: 87.9 fL (ref 80.0–100.0)
Monocytes Absolute: 0.2 10*3/uL (ref 0.1–1.0)
Monocytes Relative: 3 %
Neutro Abs: 4.1 10*3/uL (ref 1.7–7.7)
Neutrophils Relative %: 81 %
Platelets: 229 10*3/uL (ref 150–400)
RBC: 5.2 MIL/uL (ref 4.22–5.81)
RDW: 13.9 % (ref 11.5–15.5)
WBC: 5.1 10*3/uL (ref 4.0–10.5)
nRBC: 0 % (ref 0.0–0.2)

## 2020-03-11 LAB — GLUCOSE, CAPILLARY
Glucose-Capillary: 214 mg/dL — ABNORMAL HIGH (ref 70–99)
Glucose-Capillary: 208 mg/dL — ABNORMAL HIGH (ref 70–99)
Glucose-Capillary: 228 mg/dL — ABNORMAL HIGH (ref 70–99)
Glucose-Capillary: 108 mg/dL — ABNORMAL HIGH (ref 70–99)

## 2020-03-11 LAB — D-DIMER, QUANTITATIVE: D-Dimer, Quant: 0.54 ug/mL-FEU — ABNORMAL HIGH (ref 0.00–0.50)

## 2020-03-11 LAB — HEMOGLOBIN A1C
Hgb A1c MFr Bld: 8.8 % — ABNORMAL HIGH (ref 4.8–5.6)
Mean Plasma Glucose: 206 mg/dL

## 2020-03-11 LAB — FERRITIN: Ferritin: 679 ng/mL — ABNORMAL HIGH (ref 24–336)

## 2020-03-11 LAB — C-REACTIVE PROTEIN: CRP: 23.7 mg/dL — ABNORMAL HIGH (ref <1.0)

## 2020-03-11 MED ORDER — INSULIN ASPART 100 UNIT/ML ~~LOC~~ SOLN
5.0000 [IU] | Freq: Three times a day (TID) | SUBCUTANEOUS | Status: DC
  Administered 2020-03-11 – 2020-03-14 (×7): 5 [IU] via SUBCUTANEOUS

## 2020-03-11 MED ORDER — GUAIFENESIN-DM 100-10 MG/5ML PO SYRP
10.0000 mL | ORAL_SOLUTION | ORAL | Status: DC | PRN
  Administered 2020-03-14 – 2020-03-17 (×4): 10 mL via ORAL
  Filled 2020-03-11 (×4): qty 10

## 2020-03-11 MED ORDER — INSULIN GLARGINE 100 UNIT/ML ~~LOC~~ SOLN
55.0000 [IU] | Freq: Every day | SUBCUTANEOUS | Status: DC
  Administered 2020-03-11 – 2020-03-12 (×2): 55 [IU] via SUBCUTANEOUS
  Filled 2020-03-11 (×3): qty 0.55

## 2020-03-11 MED ORDER — BARICITINIB 2 MG PO TABS
4.0000 mg | ORAL_TABLET | Freq: Every day | ORAL | Status: DC
  Administered 2020-03-11 – 2020-03-16 (×6): 4 mg via ORAL
  Filled 2020-03-11 (×7): qty 2

## 2020-03-11 MED ORDER — TOCILIZUMAB 400 MG/20ML IV SOLN
8.0000 mg/kg | Freq: Once | INTRAVENOUS | Status: DC
  Filled 2020-03-11: qty 35.4

## 2020-03-11 MED ORDER — INSULIN GLARGINE 100 UNIT/ML ~~LOC~~ SOLN
50.0000 [IU] | Freq: Every day | SUBCUTANEOUS | Status: DC
  Filled 2020-03-11: qty 0.5

## 2020-03-11 NOTE — Progress Notes (Signed)
Pt did not agree to tocilizumab but baricitinib instead per Dr. Isaiah Serge. We will start the pt on baricitinib 4mg  PO qday x14 doses per the ACTT-2 Trial.

## 2020-03-11 NOTE — Progress Notes (Signed)
Patient has had improvement since starting on heated high flow nasal cannula.  He is currently on 25 L/min with SpO2 of 89% and RR of 25.  Since starting the high flow nasal cannula the patient's ROX index is improved from 2.56 to 3.80 within 3 hours.  We will continue to monitor the patient closely.  Plan: -Continue heated high flow nasal cannula, titrate flow rate -Continue prone positioning -Please let the PCCM team know if the patients respiratory status declines in any way.    Dellia Cloud, D.O. Date 03/11/2020 Time 6:59 PM [facility] Internal Medicine, PGY-1 Pager: [phone]

## 2020-03-11 NOTE — Progress Notes (Signed)
Discussed with Danny Macdonald regarding consent for initiating treatment for tocilizumab per protocol. Discussed risks and benefits involved and success with improvement in outcomes for patients requiring high flow nasal cannula. Danny Macdonald expressed understanding but states he feels 'okay' and is unsure if he requires the medication at this time. Discussed likely benefit in improvement if he receives the treatment now rather than await further respiratory decompensation. Danny Macdonald states he would like to 'think about it overnight' and decide in the morning. All other question and concerns addressed.

## 2020-03-11 NOTE — Consult Note (Signed)
NAME:  Danny Macdonald, MRN:  [id number], DOB:  1971/06/26, LOS: 1 ADMISSION DATE:  03/10/2020, CONSULTATION DATE:  03/11/2020 REFERRING MD:  Dr. Erich Montane, CHIEF COMPLAINT:  Respiratory failure 2/2 to COVID    Brief History   Danny Macdonald is a 49 y.o. male with a pertinent PMH of hypothyroidism, HTN, HLD, OSA, and presented with a week history of shortness of breath and was admitted for COVID pneumonia.   History of present illness   Danny Macdonald is a 49 y.o. male with a pertinent PMH of hypothyroidism, HTN, HLD, OSA, and presented with a week history of shortness of breath with associated nasal congestion, subjective fever, and non-productive cough.  In the ED, the patient was found to be hypoxic in the 80's and start on 10L HFNC. CXR showed bilateral opacities and COVID test was positive. Danny Macdonald was started on remdesivir and decadron.   Patient has a history of OSA and usually uses a CPAP at night, but has not been able to use CPAP since being admitted to the hospital. PCCM was consulted due to [facility].  Past Medical History  Hypothyroid, HTN, HLD, T2DM, OSA  Significant Hospital Events     Consults:  none  Procedures:  none  Significant Diagnostic Tests:  4/8 CXR - Low volume chest with streaky opacity at the bases. Normal heart size for technique. No visible effusion or pneumothorax.  Micro Data:  4/9 BC - No growth < 24 hours   Antimicrobials:  4/8 Ceftriaxone> 4/9 4/8 Azithromycin> 4/9  Interim history/subjective:  See above  Objective   Blood pressure (!) 127/91, pulse 87, temperature 98.4 F (36.9 C), resp. rate (!) 38, height 5\' 1"  (1.549 m), weight 88.5 kg, SpO2 (!) 86 %.        Intake/Output Summary (Last 24 hours) at 03/11/2020 1250 Last data filed at 03/11/2020 0810 Gross per 24 hour  Intake 1370 ml  Output 1900 ml  Net -530 ml   Filed Weights   03/10/20 1056  Weight: 88.5 kg    Examination: Physical Exam  Constitutional: Danny Macdonald is oriented to person,  place, and time. No distress.  HENT:  Head: Normocephalic and atraumatic.  Eyes: EOM are normal.  Cardiovascular: Normal rate and intact distal pulses.  No murmur heard. Pulmonary/Chest: Danny Macdonald is in respiratory distress. Danny Macdonald has rales.  Abdominal: Soft. Danny Macdonald exhibits no distension. There is no abdominal tenderness.  Musculoskeletal:        General: No edema. Normal range of motion.     Cervical back: Normal range of motion.  Neurological: Danny Macdonald is alert and oriented to person, place, and time.  Skin: Skin is warm. Danny Macdonald is diaphoretic.    Resolved Hospital Problem list     Assessment & Plan:   Acute hypoxic respiratory failure 2/2 to COVID pneumonia:  Patient room significantly tachypneic and hypoxic.  Patient is respirating at 30 to 35 breaths/min with SPO2 saturations in the low to mid 80s on 15 L nonrebreather.  Patient is currently on remdesivir and Decadron.  Otherwise Danny Macdonald denies any symptoms but states that Danny Macdonald significantly short of breath.  We will keep a close eye on the patient and if Danny Macdonald decompensates we will transfer him to the ICU. -Start heated high flow O2 - Every 1-2-hour ROX index. If down trending we will transfer to 56M. - We will start proning the patient to see if his respiratory status improves - I will start the patient on Baricitinib therapy versus difficulty Tocilizumab depending on pharmacy  availability  OSA: Patient uses a CPAP for OSA at home.  You should continue this at night for obstructive sleep apnea. -Continue CPAP nightly   Best practice:  Diet: Regular Pain/Anxiety/Delirium protocol (if indicated): Continue current pain management VAP protocol (if indicated): N/A DVT prophylaxis: Continue current DVT prophylaxis GI prophylaxis: Continue current GI profile Glucose control: Continue current diabetes management Mobility: Out of bed as tolerated Code Status: Full Family Communication: Primary team communicating with family Disposition: PCCM will monitor the  patient.  Labs   CBC: Recent Labs  Lab 03/10/20 1203 03/11/20 0338  WBC 5.8 5.1  NEUTROABS 4.0 4.1  HGB 14.0 15.2  HCT 42.8 45.7  MCV 88.6 87.9  PLT 204 229    Basic Metabolic Panel: Recent Labs  Lab 03/10/20 1203 03/11/20 0338  NA 139 138  K 3.5 4.1  CL 102 102  CO2 24 22  GLUCOSE 146* 223*  BUN 16 21*  CREATININE 1.25* 1.11  CALCIUM 8.2* 8.5*   GFR: Estimated Creatinine Clearance: 77.7 mL/min (by C-G formula based on SCr of 1.11 mg/dL). Recent Labs  Lab 03/10/20 1203 03/11/20 0338  PROCALCITON <0.10  --   WBC 5.8 5.1  LATICACIDVEN 0.9  --     Liver Function Tests: Recent Labs  Lab 03/10/20 1203 03/11/20 0338  AST 29 28  ALT 26 25  ALKPHOS 58 59  BILITOT 0.8 0.5  PROT 6.7 6.9  ALBUMIN 3.1* 2.9*   No results for input(s): LIPASE, AMYLASE in the last 168 hours. No results for input(s): AMMONIA in the last 168 hours.  ABG No results found for: PHART, PCO2ART, PO2ART, HCO3, TCO2, ACIDBASEDEF, O2SAT   Coagulation Profile: No results for input(s): INR, PROTIME in the last 168 hours.  Cardiac Enzymes: No results for input(s): CKTOTAL, CKMB, CKMBINDEX, TROPONINI in the last 168 hours.  HbA1C: Hemoglobin A1C  Date/Time Value Ref Range Status  11/11/2018 12:00 AM 10.4  Final   Hgb A1c MFr Bld  Date/Time Value Ref Range Status  03/10/2020 05:00 PM 8.8 (H) 4.8 - 5.6 % Final    Comment:    (NOTE)         Prediabetes: 5.7 - 6.4         Diabetes: >6.4         Glycemic control for adults with diabetes: <7.0   10/01/2019 10:00 AM 8.6 (H) 4.8 - 5.6 % Final    Comment:             Prediabetes: 5.7 - 6.4          Diabetes: >6.4          Glycemic control for adults with diabetes: <7.0     CBG: Recent Labs  Lab 03/10/20 1630 03/10/20 2104 03/11/20 0750 03/11/20 1233  GLUCAP 185* 256* 214* 208*    Review of Systems:   All review of systems are negative other than shortness of breath  Past Medical History  Danny Macdonald,  has a past medical history  of Anxiety, Carpal tunnel syndrome, right, Erectile dysfunction, Essential hypertension, Hyperlipidemia, Hypothyroidism, Left ventricular dysfunction, OSA (obstructive sleep apnea), and Uncontrolled type 2 diabetes mellitus with hyperglycemia (HCC).   Surgical History   History reviewed. No pertinent surgical history.   Social History   reports that Danny Macdonald quit smoking about 6 years ago. Danny Macdonald has never used smokeless tobacco. Danny Macdonald reports current alcohol use. Danny Macdonald reports that Danny Macdonald does not use drugs.   Family History   His family history includes Arthritis in  his mother; Diabetes in his child, maternal grandfather, maternal grandmother, and mother; Hypertension in his brother, father, maternal grandfather, maternal grandmother, mother, paternal grandfather, paternal grandmother, and sister.   Allergies No Known Allergies   Home Medications  Prior to Admission medications   Medication Sig Start Date End Date Taking? Authorizing Provider  amLODipine (NORVASC) 10 MG tablet Take 1 tablet (10 mg total) by mouth at bedtime. 10/01/19  Yes Charlott Rakes, MD  aspirin EC 81 MG tablet Take 1 tablet by mouth daily. 11/11/18  Yes [provider]  atorvastatin (LIPITOR) 20 MG tablet Take 1 tablet (20 mg total) by mouth daily. 10/01/19  Yes Charlott Rakes, MD  canagliflozin (INVOKANA) 100 MG TABS tablet Take 1 tablet (100 mg total) by mouth daily. 10/01/19  Yes Newlin, Charlane Ferretti, MD  glipiZIDE (GLUCOTROL) 5 MG tablet Take 1 tablet (5 mg total) by mouth daily. WITH MEAL 10/01/19  Yes Charlott Rakes, MD  Insulin Detemir (LEVEMIR FLEXTOUCH) 100 UNIT/ML Pen Inject 50 Units into the skin 2 (two) times daily. Hold dose for BS <150. 10/01/19  Yes Newlin, Enobong, MD  levothyroxine (SYNTHROID) 112 MCG tablet Take 1 tablet (112 mcg total) by mouth every morning. 10/02/19  Yes Newlin, Charlane Ferretti, MD  losartan-hydrochlorothiazide (HYZAAR) 100-12.5 MG tablet Take 1 tablet by mouth daily. 10/01/19  Yes Charlott Rakes, MD    metFORMIN (GLUCOPHAGE) 1000 MG tablet Take 1 tablet (1,000 mg total) by mouth 2 (two) times daily with a meal. 10/01/19  Yes Newlin, Enobong, MD  metoprolol succinate (TOPROL-XL) 25 MG 24 hr tablet Take 1 tablet (25 mg total) by mouth daily. 10/01/19  Yes Charlott Rakes, MD  omeprazole (PRILOSEC) 40 MG capsule Take 1 capsule (40 mg total) by mouth daily. 10/01/19  Yes Charlott Rakes, MD  Semaglutide,0.25 or 0.5MG /DOS, (OZEMPIC, 0.25 OR 0.5 MG/DOSE,) 2 MG/1.5ML SOPN Inject 0.5 mg into the skin once a week. Wednesday 02/18/20 05/18/20 Yes [provider]  sildenafil (VIAGRA) 100 MG tablet Take 1 tablet (100 mg total) by mouth as needed for erectile dysfunction. 04/29/19  Yes Scot Jun, FNP  buPROPion (WELLBUTRIN SR) 200 MG 12 hr tablet Take 1 tablet (200 mg total) by mouth 2 (two) times daily. Patient not taking: Reported on 03/10/2020 10/01/19   Charlott Rakes, MD     Critical care time:      Marianna Payment, D.O. Date 03/11/2020 Time 2:03 PM [facility] Internal Medicine, PGY-1 Pager: [phone]

## 2020-03-11 NOTE — Progress Notes (Signed)
   Subjective:  Danny Macdonald is a 49 y.o. with PMH of hypothyroidism, htn, hld, osa admit for COVID pneumonia on hospital day 1  Danny Macdonald was examined and evaluated at bedside this am. He states he had dyspnea overnight resolved after being put on non-rebreather. He states he uses CPAP at night time and states he asked a friend to bring it. He mentions some chest discomfort associated with coughing but denies fevers or chills overnight. He mentions his sinus congestion has resolved.  Objective:  Vital signs in last 24 hours: Vitals:   03/11/20 0400 03/11/20 0539 03/11/20 0751 03/11/20 0906  BP:  118/89 (!) 128/91   Pulse:  86 92   Resp:  (!) 29 (!) 24   Temp: 98.2 F (36.8 C) 98 F (36.7 C) 98.4 F (36.9 C)   TempSrc: Oral Oral Axillary   SpO2:  91% 91% (!) 88%  Weight:      Height:       Gen: Well-developed, well nourished, NAD HEENT: NCAT head, hearing intact, EOMI, MMM Neck: supple, ROM intact, wide circumference CV: RRR, S1, S2 normal, No rubs, no murmurs Pulm: Tachypneic, No rales, no wheezes Abd: Soft, BS+, NTND, No rebound, no guarding Extm: ROM intact, Peripheral pulses intact, No peripheral edema Skin: Dry, Warm, normal turgor  Neuro: AAOx3 Psych: Normal mood and affect  Assessment/Plan:  Active Problems:   Acute hypoxemic respiratory failure due to COVID-19 [facility])  Danny Macdonald is a 49 y.o. with PMH of hypothyroidism, htn, hld, osa admit for acute hypoxic respiratory failure due to COVID pneumonia  Acute hypoxic respiratory failure COVID pneumonia Hospital day 1 for COVID pneumonia. Started on remdesivir and decadron. LFTs wnl. Worsening oxygen requirement from 10L HFNC to 15L non-rebreather. Clinically well-appearing but may decompensate. - Stat ABG - C/w remdesivir, decadron - O2 as needed - Trend inflammatory markers, LFTs - Likely will need BiPAP which can only be done at ICU for COVID patients, will consult PCCM  OSA Has hx of OSA w/ CPAP use at  home. Continues to endorse hypoxia overnight. Would benefit from positive pressure - As above  T2DM Hgb a1c 8.8. On levemir 50 units, metformin, victoza, invokana at home. Cbg ~200s overnight after starting steroid therapy. - Increase Lantus to 50 units - Novolog 5 units TID qc - SSI - Glucose checks.  Hypothyroidism On levothyroxine .  TSH low normal at 0.551 - C/w home meds: levothyroxine 112 mcg  HTN Holding home losartan-HCTZ. Am bp 118/89 - C/w amlodipine - Hold losartan-HCTZ  HLD - C/w atorvastatin 20mg  daily  DVT proph: Lovenox Diet: CM, HH Gastric: pantoprazole Bowel: senokot Code: Full  Dispo: Anticipated discharge in approximately 3-4 day(s).   , MD 03/11/2020, 12:23 PM Pager: [phone]

## 2020-03-11 NOTE — Progress Notes (Signed)
Pt is a mouth breather and wears a cpap with full face mask at home. this is unavailable here per RT and pt o2sats are >90% with NRB serving as adjunct. Will continue to monitor

## 2020-03-11 NOTE — Progress Notes (Signed)
Patient is currently on Heated High Flow and cannot use CPAP at this time. RT will continue to monitor patient.

## 2020-03-11 NOTE — Progress Notes (Signed)
PCCM progress note  Informed by internal medicine team the patient is not ready to take Tocilizumab I discussed with patient in detail with the off label use of Tocilizumab and risk/benefit, recent data on efficay but he is wary of side effects including liver dysfunction and does not want unapproved medications.  He is okay with baricitinib which has received FDA EUA. We will initiate baricitinib for 4mg /day. To be continued for 14 days or till time of discharge.  MD [facility] Please see [email or web address] for pager details.  03/11/2020, 4:30 PM

## 2020-03-11 NOTE — Plan of Care (Signed)

## 2020-03-11 NOTE — Progress Notes (Signed)
  Date: 03/11/2020  Patient name: Helder Crisafulli  Medical record number: [id number]  Date of birth: 1971-02-16   I have seen and evaluated Viona Gilmore and discussed their care with the Residency Team.  In brief, patient is a 49 year old male with past medical history of hypothyroidism, OSA, type 2 diabetes and hypertension who presented to the ED with worsening shortness of breath over the last week.  Patient states that approximately 4 days ago he noted new onset nasal congestion, subjective fevers and then over the last couple days has noted progressively worsening shortness of breath with dyspnea on minimal exertion.  He was noted to be Covid positive in the ED patient states that he is recently started taking the bus as his car is broken down and he suspects that this is where he got Covid.  Patient also complains of intermittent chest pain associated with nonproductive cough.  No nausea or vomiting, no abdominal pain, no diarrhea, no lightheadedness, no syncope, no focal weakness, no tingling or numbness, no headache, no blurry vision.  Today patient states that he has persistent shortness of breath and is dyspneic with minimal exertion.  He has required increasing oxygen over the last 24 hours and is currently on nonrebreather.  PMHx, Fam Hx, and/or Soc Hx : As per resident admit note  Vitals:   03/11/20 1235 03/11/20 1445  BP: (!) 127/91   Pulse: 87   Resp: (!) 38   Temp: 98.4 F (36.9 C)   SpO2: (!) 86% 95%   General: Awake, alert, oriented x3, tachypneic and in mild respiratory distress CVS: Regular rate and rhythm, normal heart sounds Lungs: Bibasilar crackles noted on exam, tachypneic Abdomen: Soft, nontender, nondistended, normoactive bowel sounds Extremities: No edema noted, nontender to palpation Psych: Normal mood and affect HEENT: Normocephalic, atraumatic Skin: Warm and dry  Assessment and Plan: I have seen and evaluated the patient as outlined above. I agree with the  formulated Assessment and Plan as detailed in the residents' note, with the following changes:   1.  Acute hypoxic respiratory failure secondary to Covid pneumonia: -Patient presented to the ED with progressive worsening shortness of breath, subjective fevers and chills and nonproductive cough and was found to be Covid positive with infiltrates on his chest x-ray.  Patient is noted to have increasing oxygen requirements over the last 24 hours and is now currently on a nonrebreather. -ABG on 100% FiO2 had a PO2 of 96 and a PCO2 of 38 as well as a pH of 7.4 -PCCM follow-up and recommendations appreciated.  We will start the patient on heated high flow oxygen -If patient continues to deteriorate with transfer to ICU -Continue with awake proning -Continue with remdesivir and dexamethasone -Patient would benefit from tocilizumab but resident discussed risks and benefits with patient and he is unsure whether he wants to receive medication at this time and would like to think what overnight and decide in the morning. -We will continue to monitor closely -Trend inflammatory markers and LFTs -No further work-up at this time.  We will continue to monitor closely  Earl Lagos, MD 4/9/20214:24 PM

## 2020-03-12 DIAGNOSIS — U071 COVID-19: Principal | ICD-10-CM

## 2020-03-12 DIAGNOSIS — J9601 Acute respiratory failure with hypoxia: Secondary | ICD-10-CM

## 2020-03-12 LAB — COMPREHENSIVE METABOLIC PANEL
ALT: 24 U/L (ref 0–44)
AST: 28 U/L (ref 15–41)
Albumin: 2.9 g/dL — ABNORMAL LOW (ref 3.5–5.0)
Alkaline Phosphatase: 53 U/L (ref 38–126)
Anion gap: 13 (ref 5–15)
BUN: 27 mg/dL — ABNORMAL HIGH (ref 6–20)
CO2: 24 mmol/L (ref 22–32)
Calcium: 8.5 mg/dL — ABNORMAL LOW (ref 8.9–10.3)
Chloride: 103 mmol/L (ref 98–111)
Creatinine, Ser: 1.17 mg/dL (ref 0.61–1.24)
GFR calc Af Amer: >60 mL/min (ref >60)
GFR calc non Af Amer: >60 mL/min (ref >60)
Glucose, Bld: 117 mg/dL — ABNORMAL HIGH (ref 70–99)
Potassium: 4.2 mmol/L (ref 3.5–5.1)
Sodium: 140 mmol/L (ref 135–145)
Total Bilirubin: 0.4 mg/dL (ref 0.3–1.2)
Total Protein: 6.5 g/dL (ref 6.5–8.1)

## 2020-03-12 LAB — CBC WITH DIFFERENTIAL/PLATELET
Abs Immature Granulocytes: 0.02 10*3/uL (ref 0.00–0.07)
Basophils Absolute: 0 10*3/uL (ref 0.0–0.1)
Basophils Relative: 0 %
Eosinophils Absolute: 0 10*3/uL (ref 0.0–0.5)
Eosinophils Relative: 0 %
HCT: 43.8 % (ref 39.0–52.0)
Hemoglobin: 14.3 g/dL (ref 13.0–17.0)
Immature Granulocytes: 0 %
Lymphocytes Relative: 22 %
Lymphs Abs: 1.4 10*3/uL (ref 0.7–4.0)
MCH: 28.8 pg (ref 26.0–34.0)
MCHC: 32.6 g/dL (ref 30.0–36.0)
MCV: 88.1 fL (ref 80.0–100.0)
Monocytes Absolute: 0.3 10*3/uL (ref 0.1–1.0)
Monocytes Relative: 5 %
Neutro Abs: 4.6 10*3/uL (ref 1.7–7.7)
Neutrophils Relative %: 73 %
Platelets: 254 10*3/uL (ref 150–400)
RBC: 4.97 MIL/uL (ref 4.22–5.81)
RDW: 14.1 % (ref 11.5–15.5)
WBC: 6.4 10*3/uL (ref 4.0–10.5)
nRBC: 0 % (ref 0.0–0.2)

## 2020-03-12 LAB — FERRITIN: Ferritin: 582 ng/mL — ABNORMAL HIGH (ref 24–336)

## 2020-03-12 LAB — GLUCOSE, CAPILLARY
Glucose-Capillary: 103 mg/dL — ABNORMAL HIGH (ref 70–99)
Glucose-Capillary: 96 mg/dL (ref 70–99)
Glucose-Capillary: 179 mg/dL — ABNORMAL HIGH (ref 70–99)
Glucose-Capillary: 135 mg/dL — ABNORMAL HIGH (ref 70–99)

## 2020-03-12 LAB — C-REACTIVE PROTEIN: CRP: 13.7 mg/dL — ABNORMAL HIGH (ref <1.0)

## 2020-03-12 LAB — D-DIMER, QUANTITATIVE: D-Dimer, Quant: 0.39 ug/mL-FEU (ref 0.00–0.50)

## 2020-03-12 NOTE — Progress Notes (Signed)
Patient is Covid +, on Heated HFNC at this time, so he is unable to use CPAP.  Will continue to monitor.

## 2020-03-12 NOTE — Progress Notes (Signed)
[place] PCCM Progress   Brief History: 49 y/o M admitted 4/8 with fevers, nasal congestion and SOB. PMH of OSA, DM II, HTN, hypothyroidism.  Found to be hypoxic with saturations in the 80's with diffuse bilateral opacities on CXR.  COVID positive.  Admitted per IMTS for further care.    S: Pt reports feeling short of breath with exertion.  Denies resting SOB.  Wearing heated HFNC without distress. States he is glad he "took the medicine to stop the inflammation in his body".  No acute events overnight.    O: Blood pressure 124/87, pulse 76, temperature 98.3 F (36.8 C), temperature source Axillary, resp. rate (!) 25, height 5\' 1"  (1.549 m), weight 88.5 kg, SpO2 92 %.   General: adult male lying in bed in NAD HEENT: MM pink/moist, heated HFNC in place, beard, anicteric Neuro: AAOx4, speech clear, MAE / normal strength  CV: s1s2 RRR, no m/r/g PULM: non-labored on heated HFNC, able to speak without dyspnea, lungs bilaterally slightly diminished  GI: soft, bsx4 active  Extremities: warm/dry, no edema  Skin: no rashes or lesions  Recent Labs  Lab 03/10/20 1203 03/11/20 0338 03/12/20 0344  HGB 14.0 15.2 14.3  HCT 42.8 45.7 43.8  WBC 5.8 5.1 6.4  PLT 204 229 254   Recent Labs  Lab 03/10/20 1203 03/10/20 1203 03/11/20 0338 03/12/20 0344  NA 139  --  138 140  K 3.5   < > 4.1 4.2  CL 102  --  102 103  CO2 24  --  22 24  GLUCOSE 146*  --  223* 117*  BUN 16  --  21* 27*  CREATININE 1.25*  --  1.11 1.17  CALCIUM 8.2*  --  8.5* 8.5*   < > = values in this interval not displayed.     A: Acute Hypoxic Respiratory Failure secondary to COVID Pneumonia  OSA   P: Prone positioning reviewed with patient OOB as much as possible  Wean O2 for sats >85% and anticipate periods of desaturation with exertion with slow recovery. If changes in mental status or labored breathing, PCCM should be notified  Follow intermittent CXR  CPAP QHS for sleep apnea when off heated high flow   Follow inflammatory markers, if rising, consider adjusting dose of anticoagulation Continue baricitinib, decadron, remdesivir   PCCM will follow up on 4/11.  Please call back if new needs arise or change in patients clinical status.   6/11, MSN, NP-C [facility] 03/12/2020, 8:24 AM   Please see [email or web address] for pager details.

## 2020-03-12 NOTE — Progress Notes (Signed)
   Subjective:  Danny Macdonald is a 49 y.o. with PMH of hypothyroidism, htn, hld, osa admit for COVID pneumonia on hospital day 2  Danny Macdonald was examined and evaluated at bedside this am. He mentions feeling well. He states he noticed improved oxygenation with proning and discussed continuing intermittent proning. He denies any fevers, chills, nausea, vomiting diarrhea. Denies any acute complaints at this time.  Objective:  Vital signs in last 24 hours: Vitals:   03/12/20 0000 03/12/20 0358 03/12/20 0400 03/12/20 0754  BP:   (!) 116/58 124/87  Pulse:  (!) 59 (!) 58 76  Resp:  (!) 26 (!) 22 (!) 25  Temp: 98.3 F (36.8 C)  98.3 F (36.8 C) 98.3 F (36.8 C)  TempSrc: Axillary   Axillary  SpO2:  93% 94% 92%  Weight:      Height:       Physical Exam  Constitutional: He is oriented to person, place, and time and well-developed, well-nourished, and in no distress. No distress.  HENT:  Mouth/Throat: Oropharynx is clear and moist.  Heated HF in place  Eyes: Conjunctivae are normal.  Neck:  Wide circumference  Cardiovascular: Normal rate, regular rhythm, normal heart sounds and intact distal pulses.  No murmur heard. Pulmonary/Chest: Effort normal and breath sounds normal. He has no wheezes. He has no rales.  Abdominal: Soft. Bowel sounds are normal. He exhibits no distension. There is no abdominal tenderness.  Musculoskeletal:        General: No edema. Normal range of motion.     Cervical back: Normal range of motion.  Neurological: He is alert and oriented to person, place, and time.  Skin: Skin is warm and dry.   Assessment/Plan:  Active Problems:   Acute hypoxemic respiratory failure due to COVID-19 [facility])  Danny Macdonald is a 49 y.o. with PMH of hypothyroidism, htn, hld, osa admit for acute hypoxic respiratory failure due to COVID pneumonia  Acute hypoxic respiratory failure COVID pneumonia Hospital day 2 for COVID pneumonia. On remdesivir and decadron day 2. Started on  baricitinib by PCCM. LFTs wnl. Currently satting 95 on HFNC 25L. Clinically well-appearing but worsening oxygen requirement. Inflammatory markers improving: CRP 23.7->13.7 - Appreciate PCCM assistance: Baricitinib, Intermittent proning - C/w remdesivir, decadron (day 2) - O2 as needed to keep sat >85. - Trend inflammatory markers, LFTs  OSA Has hx of OSA w/ CPAP use at home. - CPAP qhs  T2DM Am cbg 103 after increasing his insulin yesterday. - C/w Lantus 55 units qhs - Novolog 5 units TID qc - SSI - Glucose checks.  Hypothyroidism On levothyroxine .  TSH low normal at 0.551 - C/w home meds: levothyroxine 112 mcg  HTN Holding home losartan-HCTZ. Am bp 124/87 - C/w amlodipine - Hold losartan-HCTZ  HLD - C/w atorvastatin 20mg  daily  DVT proph: Lovenox Diet: CM, HH Gastric: pantoprazole Bowel: senokot Code: Full  Dispo: Anticipated discharge in approximately 3-4 day(s).   , MD 03/12/2020, 8:52 AM Pager: [phone]

## 2020-03-13 DIAGNOSIS — J1282 Pneumonia due to coronavirus disease 2019: Secondary | ICD-10-CM

## 2020-03-13 DIAGNOSIS — U071 COVID-19: Secondary | ICD-10-CM | POA: Diagnosis not present

## 2020-03-13 LAB — CBC WITH DIFFERENTIAL/PLATELET
Abs Immature Granulocytes: 0.02 10*3/uL (ref 0.00–0.07)
Basophils Absolute: 0 10*3/uL (ref 0.0–0.1)
Basophils Relative: 0 %
Eosinophils Absolute: 0 10*3/uL (ref 0.0–0.5)
Eosinophils Relative: 0 %
HCT: 43.6 % (ref 39.0–52.0)
Hemoglobin: 14.2 g/dL (ref 13.0–17.0)
Immature Granulocytes: 0 %
Lymphocytes Relative: 22 %
Lymphs Abs: 1.2 10*3/uL (ref 0.7–4.0)
MCH: 28.7 pg (ref 26.0–34.0)
MCHC: 32.6 g/dL (ref 30.0–36.0)
MCV: 88.1 fL (ref 80.0–100.0)
Monocytes Absolute: 0.4 10*3/uL (ref 0.1–1.0)
Monocytes Relative: 7 %
Neutro Abs: 3.9 10*3/uL (ref 1.7–7.7)
Neutrophils Relative %: 71 %
Platelets: 262 10*3/uL (ref 150–400)
RBC: 4.95 MIL/uL (ref 4.22–5.81)
RDW: 14.1 % (ref 11.5–15.5)
WBC: 5.5 10*3/uL (ref 4.0–10.5)
nRBC: 0 % (ref 0.0–0.2)

## 2020-03-13 LAB — COMPREHENSIVE METABOLIC PANEL
ALT: 24 U/L (ref 0–44)
AST: 27 U/L (ref 15–41)
Albumin: 2.8 g/dL — ABNORMAL LOW (ref 3.5–5.0)
Alkaline Phosphatase: 52 U/L (ref 38–126)
Anion gap: 12 (ref 5–15)
BUN: 30 mg/dL — ABNORMAL HIGH (ref 6–20)
CO2: 25 mmol/L (ref 22–32)
Calcium: 8.4 mg/dL — ABNORMAL LOW (ref 8.9–10.3)
Chloride: 103 mmol/L (ref 98–111)
Creatinine, Ser: 1.25 mg/dL — ABNORMAL HIGH (ref 0.61–1.24)
GFR calc Af Amer: >60 mL/min (ref >60)
GFR calc non Af Amer: >60 mL/min (ref >60)
Glucose, Bld: 93 mg/dL (ref 70–99)
Potassium: 3.7 mmol/L (ref 3.5–5.1)
Sodium: 140 mmol/L (ref 135–145)
Total Bilirubin: 0.4 mg/dL (ref 0.3–1.2)
Total Protein: 6.4 g/dL — ABNORMAL LOW (ref 6.5–8.1)

## 2020-03-13 LAB — GLUCOSE, CAPILLARY
Glucose-Capillary: 102 mg/dL — ABNORMAL HIGH (ref 70–99)
Glucose-Capillary: 48 mg/dL — ABNORMAL LOW (ref 70–99)
Glucose-Capillary: 131 mg/dL — ABNORMAL HIGH (ref 70–99)
Glucose-Capillary: 142 mg/dL — ABNORMAL HIGH (ref 70–99)
Glucose-Capillary: 216 mg/dL — ABNORMAL HIGH (ref 70–99)

## 2020-03-13 LAB — FERRITIN: Ferritin: 464 ng/mL — ABNORMAL HIGH (ref 24–336)

## 2020-03-13 LAB — C-REACTIVE PROTEIN: CRP: 7.6 mg/dL — ABNORMAL HIGH (ref <1.0)

## 2020-03-13 LAB — D-DIMER, QUANTITATIVE: D-Dimer, Quant: 0.37 ug/mL-FEU (ref 0.00–0.50)

## 2020-03-13 MED ORDER — SODIUM CHLORIDE 0.9 % IV BOLUS
500.0000 mL | Freq: Once | INTRAVENOUS | Status: AC
  Administered 2020-03-13: 500 mL via INTRAVENOUS

## 2020-03-13 MED ORDER — INSULIN GLARGINE 100 UNIT/ML ~~LOC~~ SOLN
50.0000 [IU] | Freq: Every day | SUBCUTANEOUS | Status: DC
  Administered 2020-03-13: 50 [IU] via SUBCUTANEOUS
  Filled 2020-03-13 (×2): qty 0.5

## 2020-03-13 NOTE — Progress Notes (Signed)
[place] PCCM Progress   Brief History: 49 y/o M admitted 4/8 with fevers, nasal congestion and SOB. PMH of OSA, DM II, HTN, hypothyroidism.  Found to be hypoxic with saturations in the 80's with diffuse bilateral opacities on CXR.  COVID positive.  Admitted per IMTS for further care.    S: No events. Sats remain in 80s, on 25L 0.6 FiO2. RR mildly elevated. Speaking in full sentences. Did not know how to use incentive spirometer.  O: Blood pressure 124/87, pulse 76, temperature 98.3 F (36.8 C), temperature source Axillary, resp. rate (!) 25, height 5\' 1"  (1.549 m), weight 88.5 kg, SpO2 92 %.   GEN: middle aged man in NAD HEENT: MMM, no thrush, trachea midline CV: RRR, ext warm PULM: Clear but shallow inspiratory efforts, mild tachypnea GI: Soft, hypoactive BS EXT: No edema NEURO: Moves all 4 ext to command PSYCH: RASS 0, fair insight SKIN: diabetic changes of ext  CRP trending down as is ferritin/ d dimer    A: Acute Hypoxic Respiratory Failure secondary to COVID Pneumonia- improving OSA  Poorly controlled DM Deconditioning  P: Really need to encourage IS and proning, these have been shown to reduce progression to mechanical ventilation, I re-emphasized this with patient.  Continue O2 titrated to mental status and comfort  CPAP at night is ideal, can bleed in O2 if needed  Remdesivir, steroids, and barcitinib are fine as ordered  Not really adding anything to his care, will sign off, call if any specific questions, he seems to be headed in right direction.  MD PCCM

## 2020-03-13 NOTE — Progress Notes (Signed)
Patient on 15L HFNC and unable to wear CPAP at this time due to O2 demand. RT will monitor as needed.

## 2020-03-13 NOTE — Progress Notes (Signed)
Hypoglycemic Event  CBG: 48  Treatment: Given 240 cc juice.  Symptoms: Patient felt "low"  Follow-up CBG: Time:0825 CBG Result:102  Possible Reasons for Event: Patient did not have breakfast yet.  Comments/MD notified: N/A    Jon Gills

## 2020-03-13 NOTE — Progress Notes (Signed)
   Subjective:  Danny Macdonald is a 49 y.o. with PMH of hypothyroidism, htn, hld, osa admit for COVID pneumonia on hospital day 3  Danny Macdonald was examined and evaluated at bedside this am. He was observed resting comfortably in bed. He mentions that he has been OOB as much as possible and spend most of the day yesterday in bedside chair. He mentions having improvement in his dyspnea and states overall he feels better. Denies any fevers, chills, chest pain, focal weakness, palpitations, or worsening cough.  Objective:  Vital signs in last 24 hours: Vitals:   03/13/20 0000 03/13/20 0011 03/13/20 0314 03/13/20 0400  BP: 109/77  109/77   Pulse: 75  62   Resp: (!) 29  (!) 26   Temp:  98.4 F (36.9 C)  98.9 F (37.2 C)  TempSrc:      SpO2: (!) 87%     Weight:      Height:       Gen: Well-developed, obese, NAD HEENT: NCAT head, hearing intact, EOMI, MMM, Nichols in place Neck: supple, ROM intact, wide circumference CV: RRR, S1, S2 normal, No rubs, no murmurs, no gallops Pulm: CTAB, No rales, no wheezes Extm: ROM intact, Peripheral pulses intact, No peripheral edema Skin: Dry, Warm, normal turgor, no rashes, lesions, wounds.  Neuro: AAOx3 Psych: Normal mood and affect  Assessment/Plan:  Active Problems:   Acute hypoxemic respiratory failure due to COVID-19 Danny Macdonald)  Danny Macdonald is a 49 y.o. with PMH of hypothyroidism, htn, hld, osa admit for acute hypoxic respiratory failure due to COVID pneumonia  Acute hypoxic respiratory failure COVID pneumonia Hospital day 2 for COVID pneumonia. On remdesivir and decadron day 3. On day 2 of baricitinib. LFTs wnl. Currently satting 86 on HFNC 25L. Oxygen requirement continues to be high. Inflammatory markers improving: CRP 23.7->13.7->7.6 - Appreciate PCCM assistance: Baricitinib (day 2), Intermittent proning - C/w remdesivir, decadron (day 3) - O2 as needed to keep sat >85. - Trend inflammatory markers, LFTs  OSA Has hx of OSA w/ CPAP use at  home. - CPAP qhs  T2DM Had hypoglycemic episode this am down to 48. - Decrease Lantus to 50 units qhs - Novolog 5 units TID qc - SSI - Glucose checks.  Hypothyroidism On levothyroxine .  TSH low normal at 0.551 - C/w home meds: levothyroxine 112 mcg  HTN Holding home losartan-HCTZ. Am bp 124/88 - C/w amlodipine - Hold losartan-HCTZ  HLD - C/w atorvastatin 20mg  daily  DVT proph: Lovenox Diet: CM, HH Gastric: pantoprazole Bowel: senokot Code: Full  Dispo: Anticipated discharge in approximately 2-3 day(s).   , MD 03/13/2020, 6:49 AM Pager: [phone]

## 2020-03-14 DIAGNOSIS — U071 COVID-19: Secondary | ICD-10-CM | POA: Diagnosis not present

## 2020-03-14 LAB — COMPREHENSIVE METABOLIC PANEL
ALT: 26 U/L (ref 0–44)
AST: 27 U/L (ref 15–41)
Albumin: 2.7 g/dL — ABNORMAL LOW (ref 3.5–5.0)
Alkaline Phosphatase: 51 U/L (ref 38–126)
Anion gap: 13 (ref 5–15)
BUN: 25 mg/dL — ABNORMAL HIGH (ref 6–20)
CO2: 25 mmol/L (ref 22–32)
Calcium: 8.2 mg/dL — ABNORMAL LOW (ref 8.9–10.3)
Chloride: 102 mmol/L (ref 98–111)
Creatinine, Ser: 1.41 mg/dL — ABNORMAL HIGH (ref 0.61–1.24)
GFR calc Af Amer: >60 mL/min (ref >60)
GFR calc non Af Amer: 59 mL/min — ABNORMAL LOW (ref >60)
Glucose, Bld: 195 mg/dL — ABNORMAL HIGH (ref 70–99)
Potassium: 4.2 mmol/L (ref 3.5–5.1)
Sodium: 140 mmol/L (ref 135–145)
Total Bilirubin: 0.5 mg/dL (ref 0.3–1.2)
Total Protein: 6 g/dL — ABNORMAL LOW (ref 6.5–8.1)

## 2020-03-14 LAB — CBC WITH DIFFERENTIAL/PLATELET
Abs Immature Granulocytes: 0.02 10*3/uL (ref 0.00–0.07)
Basophils Absolute: 0 10*3/uL (ref 0.0–0.1)
Basophils Relative: 0 %
Eosinophils Absolute: 0 10*3/uL (ref 0.0–0.5)
Eosinophils Relative: 0 %
HCT: 42.4 % (ref 39.0–52.0)
Hemoglobin: 13.8 g/dL (ref 13.0–17.0)
Immature Granulocytes: 0 %
Lymphocytes Relative: 22 %
Lymphs Abs: 1.4 10*3/uL (ref 0.7–4.0)
MCH: 28.5 pg (ref 26.0–34.0)
MCHC: 32.5 g/dL (ref 30.0–36.0)
MCV: 87.6 fL (ref 80.0–100.0)
Monocytes Absolute: 0.5 10*3/uL (ref 0.1–1.0)
Monocytes Relative: 8 %
Neutro Abs: 4.3 10*3/uL (ref 1.7–7.7)
Neutrophils Relative %: 70 %
Platelets: 259 10*3/uL (ref 150–400)
RBC: 4.84 MIL/uL (ref 4.22–5.81)
RDW: 13.9 % (ref 11.5–15.5)
WBC: 6.2 10*3/uL (ref 4.0–10.5)
nRBC: 0 % (ref 0.0–0.2)

## 2020-03-14 LAB — GLUCOSE, CAPILLARY
Glucose-Capillary: 93 mg/dL (ref 70–99)
Glucose-Capillary: 57 mg/dL — ABNORMAL LOW (ref 70–99)
Glucose-Capillary: 65 mg/dL — ABNORMAL LOW (ref 70–99)
Glucose-Capillary: 98 mg/dL (ref 70–99)
Glucose-Capillary: 234 mg/dL — ABNORMAL HIGH (ref 70–99)
Glucose-Capillary: 224 mg/dL — ABNORMAL HIGH (ref 70–99)

## 2020-03-14 LAB — FERRITIN: Ferritin: 364 ng/mL — ABNORMAL HIGH (ref 24–336)

## 2020-03-14 LAB — C-REACTIVE PROTEIN: CRP: 4.4 mg/dL — ABNORMAL HIGH (ref <1.0)

## 2020-03-14 LAB — D-DIMER, QUANTITATIVE: D-Dimer, Quant: 0.46 ug/mL-FEU (ref 0.00–0.50)

## 2020-03-14 MED ORDER — SODIUM CHLORIDE 0.9 % IV BOLUS
1000.0000 mL | Freq: Once | INTRAVENOUS | Status: AC
  Administered 2020-03-14: 1000 mL via INTRAVENOUS

## 2020-03-14 MED ORDER — INSULIN GLARGINE 100 UNIT/ML ~~LOC~~ SOLN
40.0000 [IU] | Freq: Every day | SUBCUTANEOUS | Status: DC
  Administered 2020-03-14: 40 [IU] via SUBCUTANEOUS
  Filled 2020-03-14 (×2): qty 0.4

## 2020-03-14 MED ORDER — INSULIN ASPART 100 UNIT/ML ~~LOC~~ SOLN
3.0000 [IU] | Freq: Three times a day (TID) | SUBCUTANEOUS | Status: DC
  Administered 2020-03-14 – 2020-03-16 (×4): 3 [IU] via SUBCUTANEOUS

## 2020-03-14 MED ORDER — ORAL CARE MOUTH RINSE
15.0000 mL | Freq: Two times a day (BID) | OROMUCOSAL | Status: DC
  Administered 2020-03-14 – 2020-03-17 (×7): 15 mL via OROMUCOSAL

## 2020-03-14 NOTE — Progress Notes (Signed)
Hypoglycemic Event  CBG: 57  Treatment: 8 oz juice/soda  Symptoms: None   Follow-up CBG: Time:1237 CBG Result:65  Possible Reasons for Event: Unknown   Hypoglycemic Event  CBG: 65  Treatment: 8 oz juice/soda  Symptoms: None  Follow-up CBG: Time:1308 CBG Result:98  Possible Reasons for Event: Unknown    Danny Macdonald

## 2020-03-14 NOTE — Progress Notes (Signed)
   Subjective:  Danny Macdonald is a 49 y.o. with PMH of hypothyroidism, htn, hld, osa admit for COVID pneumonia on hospital day 4  Danny Macdonald was examined and evaluated at bedside this am. Danny Macdonald mentions that Danny Macdonald dyspnea on exertion is still present but it 'hasn't gotten worse.' Denies any fevers, chills, nausea, vomiting, diarrhea, chest pain.  Objective:  Vital signs in last 24 hours: Vitals:   03/14/20 0000 03/14/20 0200 03/14/20 0415 03/14/20 0622  BP:    117/87  Pulse: 61 (!) 55 (!) 58 78  Resp: (!) 29 (!) 26 (!) 21 16  Temp:    99.2 F (37.3 C)  TempSrc:    Oral  SpO2: 94% 91% 97% 93%  Weight:      Height:       Physical Exam  Constitutional: Danny Macdonald is oriented to person, place, and time and well-developed, well-nourished, and in no distress.  HENT:  Mouth/Throat: Oropharynx is clear and moist.  HFNC in place and functioning  Eyes: Conjunctivae are normal.  Neck:  Wide circumference  Cardiovascular: Normal rate, regular rhythm, normal heart sounds and intact distal pulses.  No murmur heard. Pulmonary/Chest: Effort normal. Danny Macdonald has no wheezes.  Bibasilar crackles  Abdominal: Soft. Bowel sounds are normal. Danny Macdonald exhibits no distension.  Musculoskeletal:        General: No edema. Normal range of motion.     Cervical back: Normal range of motion.  Neurological: Danny Macdonald is alert and oriented to person, place, and time.  Skin: Skin is warm and dry.   Assessment/Plan:  Active Problems:   Pneumonia due to COVID-19 virus  Danny Macdonald is a 49 y.o. with PMH of hypothyroidism, htn, hld, osa admit for acute hypoxic respiratory failure due to COVID pneumonia  Acute hypoxic respiratory failure COVID pneumonia Hospital day 2 for COVID pneumonia. On remdesivir and decadron day 4. On day 3 of baricitinib. LFTs wnl. Currently satting 88 on HFNC 15L Improved from 30L over the weekend. Inflammatory markers improving: CRP 23.7->13.7->7.6->4.4 - Appreciate PCCM assistance: Baricitinib (day 3),  Intermittent proning - C/w remdesivir, decadron (day 4) - O2 as needed to keep sat >85. - Trend inflammatory markers, LFTs  Acute Kidney Injury Creatinine 1.41 this am. Slightly elevated from 1.11 baseline. Likely increased insensible losses in setting of active infection and high flow Danny Macdonald - NS - Trend renal fx  OSA Has hx of OSA w/ CPAP use at home. - CPAP qhs  T2DM Am cbg 93. Elevated bg during day time. Insulin requirement 50 lantus + 8x3 novolog - C/w Lantus to 50 units qhs - Increase Novolog 7 units TID qc - SSI - Glucose checks.  Hypothyroidism On levothyroxine .  TSH low normal at 0.551 - C/w home meds: levothyroxine 112 mcg  HTN Holding home losartan-HCTZ. Am bp 115/80 - C/w amlodipine - Hold losartan-HCTZ  HLD - C/w atorvastatin 20mg  daily  DVT proph: Lovenox Diet: CM, HH Gastric: pantoprazole Bowel: senokot Code: Full  Dispo: Anticipated discharge in approximately 2-3 day(s).   , MD 03/14/2020, 6:53 AM Pager: [phone]

## 2020-03-14 NOTE — Progress Notes (Signed)
Inpatient Diabetes Program Recommendations  AACE/ADA: New Consensus Statement on Inpatient Glycemic Control (2015)  Target Ranges:  Prepandial:   less than 140 mg/dL      Peak postprandial:   less than 180 mg/dL (1-2 hours)      Critically ill patients:  140 - 180 mg/dL   Lab Results  Component Value Date   GLUCAP 65 (L) 03/14/2020   HGBA1C 8.8 (H) 03/10/2020    Review of Glycemic Control Results for Danny Macdonald, Danny Macdonald (MRN [id number]) as of 03/14/2020 12:51  Ref. Range 03/13/2020 07:46 03/13/2020 08:25 03/13/2020 11:03 03/13/2020 16:51 03/13/2020 21:23 03/14/2020 07:49 03/14/2020 12:01 03/14/2020 12:37  Glucose-Capillary Latest Ref Range: 70 - 99 mg/dL 48 (L) 111 (H) 552 (H) 142 (H) 216 (H) 93 57 (L) 65 (L)   Diabetes history: DM2 Outpatient Diabetes medications: Levemir 50 U daily, Glucotrol 5 mg daily, Invokana daily, Metformin 1000 mg BID Current orders for Inpatient glycemic control: Levemir 50 units daily + Novolog 5 units tid + Novolog resistant correction tid + hs 0-5 units  Inpatient Diabetes Program Recommendations:   -Decrease Levemir to 45 units daily -Decrease Novolog correction to sensitive tid + hs -Decrease Novolog Meal coverage to 3 units tid if eats 50%  Thank you, Darel Hong E. Romain Erion, RN, MSN, CDE  Diabetes Coordinator Inpatient Glycemic Control Team Team Pager [phone] (8am-5pm) 03/14/2020 1:15 PM

## 2020-03-15 DIAGNOSIS — U071 COVID-19: Secondary | ICD-10-CM | POA: Diagnosis not present

## 2020-03-15 LAB — COMPREHENSIVE METABOLIC PANEL
ALT: 25 U/L (ref 0–44)
AST: 23 U/L (ref 15–41)
Albumin: 2.6 g/dL — ABNORMAL LOW (ref 3.5–5.0)
Alkaline Phosphatase: 50 U/L (ref 38–126)
Anion gap: 9 (ref 5–15)
BUN: 24 mg/dL — ABNORMAL HIGH (ref 6–20)
CO2: 25 mmol/L (ref 22–32)
Calcium: 8.1 mg/dL — ABNORMAL LOW (ref 8.9–10.3)
Chloride: 103 mmol/L (ref 98–111)
Creatinine, Ser: 1.07 mg/dL (ref 0.61–1.24)
GFR calc Af Amer: >60 mL/min (ref >60)
GFR calc non Af Amer: >60 mL/min (ref >60)
Glucose, Bld: 159 mg/dL — ABNORMAL HIGH (ref 70–99)
Potassium: 3.8 mmol/L (ref 3.5–5.1)
Sodium: 137 mmol/L (ref 135–145)
Total Bilirubin: 0.6 mg/dL (ref 0.3–1.2)
Total Protein: 6 g/dL — ABNORMAL LOW (ref 6.5–8.1)

## 2020-03-15 LAB — CBC WITH DIFFERENTIAL/PLATELET
Abs Immature Granulocytes: 0.02 10*3/uL (ref 0.00–0.07)
Basophils Absolute: 0 10*3/uL (ref 0.0–0.1)
Basophils Relative: 0 %
Eosinophils Absolute: 0 10*3/uL (ref 0.0–0.5)
Eosinophils Relative: 0 %
HCT: 40.4 % (ref 39.0–52.0)
Hemoglobin: 13.4 g/dL (ref 13.0–17.0)
Immature Granulocytes: 0 %
Lymphocytes Relative: 21 %
Lymphs Abs: 1.4 10*3/uL (ref 0.7–4.0)
MCH: 28.6 pg (ref 26.0–34.0)
MCHC: 33.2 g/dL (ref 30.0–36.0)
MCV: 86.3 fL (ref 80.0–100.0)
Monocytes Absolute: 0.5 10*3/uL (ref 0.1–1.0)
Monocytes Relative: 7 %
Neutro Abs: 4.8 10*3/uL (ref 1.7–7.7)
Neutrophils Relative %: 72 %
Platelets: 233 10*3/uL (ref 150–400)
RBC: 4.68 MIL/uL (ref 4.22–5.81)
RDW: 13.6 % (ref 11.5–15.5)
WBC: 6.7 10*3/uL (ref 4.0–10.5)
nRBC: 0 % (ref 0.0–0.2)

## 2020-03-15 LAB — GLUCOSE, CAPILLARY
Glucose-Capillary: 73 mg/dL (ref 70–99)
Glucose-Capillary: 129 mg/dL — ABNORMAL HIGH (ref 70–99)
Glucose-Capillary: 228 mg/dL — ABNORMAL HIGH (ref 70–99)
Glucose-Capillary: 172 mg/dL — ABNORMAL HIGH (ref 70–99)

## 2020-03-15 LAB — CULTURE, BLOOD (ROUTINE X 2)
Culture: NO GROWTH
Culture: NO GROWTH
Special Requests: ADEQUATE
Special Requests: ADEQUATE

## 2020-03-15 LAB — FERRITIN: Ferritin: 297 ng/mL (ref 24–336)

## 2020-03-15 LAB — D-DIMER, QUANTITATIVE: D-Dimer, Quant: 0.43 ug/mL-FEU (ref 0.00–0.50)

## 2020-03-15 LAB — C-REACTIVE PROTEIN: CRP: 2.6 mg/dL — ABNORMAL HIGH (ref <1.0)

## 2020-03-15 MED ORDER — INSULIN DETEMIR 100 UNIT/ML ~~LOC~~ SOLN
20.0000 [IU] | Freq: Two times a day (BID) | SUBCUTANEOUS | Status: DC
  Administered 2020-03-15 – 2020-03-17 (×4): 20 [IU] via SUBCUTANEOUS
  Filled 2020-03-15 (×6): qty 0.2

## 2020-03-15 NOTE — Progress Notes (Signed)
   Subjective: The patient endorsed feeling much improved overnight. He was feeling less short of breath. He had several questions regarding the next steps of care which were answered. He is agreeable to further care until his oxygenation status improves sufficiently that he may be discharged.   Objective:  Vital signs in last 24 hours: Vitals:   03/15/20 0230 03/15/20 0427 03/15/20 0847 03/15/20 1003  BP:  100/60 115/68 121/75  Pulse:  70 65 65  Resp: (!) 24 (!) 22 16   Temp:  98.6 F (37 C) 98.4 F (36.9 C)   TempSrc:  Oral Oral   SpO2:  96% 92%   Weight:      Height:       General: A/O x4, in no acute distress, afebrile, nondiaphoretic HEENT: PEERL, EMO intact Cardio: RRR, no mrg's  Pulmonary: CTA bilaterally, no wheezing or crackles  Abdomen: Bowel sounds normal, soft, nontender  Psych: Appropriate affect, not depressed in appearance, engages well  Assessment/Plan:  Active Problems:   Pneumonia due to COVID-19 virus  Assessment: Danny Macdonald is a 49 yo M w/ a PMHx notable for hypothyroidism, HTN, HLD, OSA admitted for acute hypoxic respiratory failure due to COVID pneumonia. He has gradually improved and appears to be nearing discharge in the next 1-2 days once off of highflow oxygen.   Plan: Acute hypoxic respiratory failure: Covid pneumonia: Hospital day 3 today for pneumonia.  Patient continues on remdesivir and Decadron now day 5.  He is on day 4 of baricitinib.  Is currently saturating well on high flow nasal cannula at 5 L was markedly improved from this weekend who is on 3 L. Plan: -Appreciate PCCM assistance -Continue remdesivir, present neb, Decadron -O2 sat to be above 85 -Continue treating simply markers and LFTs  DMII: Patient with previously moderately well-controlled diabetes.  Now requiring increasing doses of insulin secondary to steroids.  Due to episodes of hypoglycemia we will adjust his insulin regimen. Plan: -Stop Lantus 40 units  daily -Levemir 20 units twice daily -Continue SSI -Decrease NovoLog to 3 units TID WC  Acute renal injury: Serum creatinine 1.07/1.4 the prior day.  No further.  Will monitor daily  OSA: History of OSA CPAP at home.  Continue CPAP.  Hypothyroidism: On levothyroxine 112 MCG's daily.  We will continue his home levothyroxine.   Diet: Regular Code: Full DVT PPX: lovenox Fluids: N/a Dispo: Anticipated discharge in approximately 2 day(s).   Lanelle Bal, MD 03/15/2020, 11:35 AM

## 2020-03-16 DIAGNOSIS — U071 COVID-19: Secondary | ICD-10-CM | POA: Diagnosis not present

## 2020-03-16 LAB — GLUCOSE, CAPILLARY
Glucose-Capillary: 80 mg/dL (ref 70–99)
Glucose-Capillary: 160 mg/dL — ABNORMAL HIGH (ref 70–99)
Glucose-Capillary: 223 mg/dL — ABNORMAL HIGH (ref 70–99)
Glucose-Capillary: 210 mg/dL — ABNORMAL HIGH (ref 70–99)

## 2020-03-16 MED ORDER — INSULIN ASPART 100 UNIT/ML ~~LOC~~ SOLN
5.0000 [IU] | Freq: Three times a day (TID) | SUBCUTANEOUS | Status: DC
  Administered 2020-03-16 – 2020-03-17 (×4): 5 [IU] via SUBCUTANEOUS

## 2020-03-16 NOTE — Progress Notes (Signed)
SATURATION QUALIFICATIONS: (This note is used to comply with regulatory documentation for home oxygen)  Patient Saturations on Room Air at Rest = 94%  Patient Saturations on Room Air while Ambulating = 85%  Patient Saturations on 2Liters of oxygen while Ambulating = 92%  Please briefly explain why patient needs home oxygen: patient requires oxygen when ambulating down the hall, no complaints of SOB or chest pain or discomfort.

## 2020-03-16 NOTE — Progress Notes (Signed)
   Subjective:  Danny Macdonald is a 49 y.o. with PMH of hypothyroidism, htn, hld, osa admit for COVID pneumonia on hospital day 6  Danny Macdonald was examined and evaluated at bedside this am. He mentions feeling much improved compared to last week. Denies any significant dyspnea at rest but continuing to endorse some with exertion. Discussed in detail regarding safe quarantine practices at home as he is getting close to discharge. Danny Macdonald expressed understanding.  Objective:  Vital signs in last 24 hours: Vitals:   03/15/20 1844 03/15/20 2103 03/16/20 0512 03/16/20 0923  BP:  116/83 108/69 108/67  Pulse:  (!) 57 (!) 53 64  Resp:      Temp:  98.5 F (36.9 C) 98.4 F (36.9 C)   TempSrc:  Oral Oral   SpO2: 90% 93% 96%   Weight:      Height:       Physical Exam  Constitutional: He is well-developed, well-nourished, and in no distress. No distress.  HENT:  Mouth/Throat: Oropharynx is clear and moist.  HFNC in place  Eyes: Conjunctivae are normal.  Cardiovascular: Normal rate, regular rhythm, normal heart sounds and intact distal pulses.  No murmur heard. Pulmonary/Chest: Effort normal and breath sounds normal. He has no wheezes. He has no rales.  Musculoskeletal:        General: No edema. Normal range of motion.  Skin: Skin is warm and dry. No rash noted. He is not diaphoretic.   Assessment/Plan:  Active Problems:   Pneumonia due to COVID-19 virus  Danny Macdonald is a 49 y.o. with PMH of hypothyroidism, htn, hld, osa admit for acute hypoxic respiratory failure due to COVID pneumonia  Acute hypoxic respiratory failure COVID pneumonia Hospital day 6 for COVID pneumonia. Finished 5-day course of remdesivir. On decadron day 7. On day 6 of baricitinib. LFTs wnl. Oxygen requirement down-trending to 5L HFNC this am. Inflammatory markers improving: CRP 23.7->13.7->7.6->4.4->2.6 - Appreciate PCCM assistance: Baricitinib (day 5), Intermittent proning - C/w decadron, baricitinib - O2  as needed to keep sat >85. - Trend inflammatory markers, LFTs  Acute Kidney Injury Back to baseline. Creatinine 1.07 - Resolved  OSA Has hx of OSA w/ CPAP use at home. - CPAP qhs  T2DM Am cbg 80. Elevated bg during day time. Insulin requirement 40 Levemir + 16units novolog - C/w Lantus 40 units BID - Increase Novolog 5 units TID qc - SSI - Glucose checks.  DVT proph: Lovenox Diet: CM, HH Gastric: pantoprazole Bowel: senokot Code: Full  Dispo: Anticipated discharge in approximately 1-2 day(s).   Danny Barrio, MD 03/16/2020, 10:12 AM Pager: [phone]

## 2020-03-17 DIAGNOSIS — U071 COVID-19: Secondary | ICD-10-CM | POA: Diagnosis not present

## 2020-03-17 LAB — COMPREHENSIVE METABOLIC PANEL
ALT: 26 U/L (ref 0–44)
AST: 18 U/L (ref 15–41)
Albumin: 2.5 g/dL — ABNORMAL LOW (ref 3.5–5.0)
Alkaline Phosphatase: 52 U/L (ref 38–126)
Anion gap: 9 (ref 5–15)
BUN: 24 mg/dL — ABNORMAL HIGH (ref 6–20)
CO2: 25 mmol/L (ref 22–32)
Calcium: 8.2 mg/dL — ABNORMAL LOW (ref 8.9–10.3)
Chloride: 104 mmol/L (ref 98–111)
Creatinine, Ser: 0.99 mg/dL (ref 0.61–1.24)
GFR calc Af Amer: >60 mL/min (ref >60)
GFR calc non Af Amer: >60 mL/min (ref >60)
Glucose, Bld: 192 mg/dL — ABNORMAL HIGH (ref 70–99)
Potassium: 4 mmol/L (ref 3.5–5.1)
Sodium: 138 mmol/L (ref 135–145)
Total Bilirubin: 0.6 mg/dL (ref 0.3–1.2)
Total Protein: 5.8 g/dL — ABNORMAL LOW (ref 6.5–8.1)

## 2020-03-17 LAB — GLUCOSE, CAPILLARY
Glucose-Capillary: 97 mg/dL (ref 70–99)
Glucose-Capillary: 109 mg/dL — ABNORMAL HIGH (ref 70–99)

## 2020-03-17 MED ORDER — DEXAMETHASONE 6 MG PO TABS: 6.0000 mg | ORAL_TABLET | ORAL | 0 refills | Status: DC

## 2020-03-17 NOTE — Progress Notes (Signed)
Danny Macdonald to be D/C'd Home per MD order.  Discussed with the patient and all questions fully answered.  VSS, Skin clean, dry and intact without evidence of skin break down, no evidence of skin tears noted. IV catheter discontinued intact. Site without signs and symptoms of complications. Dressing and pressure applied.  An After Visit Summary was printed and given to the patient. D/c education completed with patient including follow up instructions, medication list, d/c activities limitations if indicated, with other d/c instructions as indicated by MD - patient able to verbalize understanding, all questions fully answered.   Patient instructed to return to ED, call 911, or call MD for any changes in condition.   Patient escorted via WC, and D/C home via private auto.  [name] 03/17/2020 3:04 PM

## 2020-03-17 NOTE — Discharge Instructions (Signed)

## 2020-03-17 NOTE — TOC Transition Note (Addendum)
Transition of Care [facility]) - CM/SW Discharge Note   Patient Details  Name: Danny Macdonald MRN: [id number] Date of Birth: [birth year]  Transition of [facility]) CM/SW Contact:  Cherylann Parr, RN Phone Number: 03/17/2020, 11:56 AM   Clinical Narrative:   Pt deemed stable for discharge home today. Pt informed CM that PTA he was independent from home with family.  Pt confirms he has a PCP and denied barriers with paying for medication. Pt in agreement with home oxygen - CM offered choice - Pt chose [facility] - agency accepts referral.  [facility] will provide POC tank to unit prior to discharge.  Pt will not have to wait for home oxygen equipment to be delivered before leaving [facility]. Pt aware that [facility] will contact him post discharge to set up home oxygen equipment delivery.  Discharge order written - no outstanding TOC needs determined - CM signing off    Final next level of care: Home/Self Care Barriers to Discharge: Barriers Resolved   Patient Goals and CMS Choice   CMS Medicare.gov Compare Post Acute Care list provided to:: Patient Choice offered to / list presented to : Patient  Discharge Placement                       Discharge Plan and Services                DME Arranged: Oxygen DME Agency: [facility] Date DME Agency Contacted: 03/17/20 Time DME Agency Contacted: 1156 Representative spoke with at DME Agency: Zack            Social Determinants of Health (SDOH) Interventions     Readmission Risk Interventions No flowsheet data found.

## 2020-03-17 NOTE — Discharge Summary (Signed)
Name: Danny Macdonald MRN: [id number] DOB: 02/06/1971 49 y.o. PCP: Georgana Curio  Date of Admission: 03/10/2020 10:48 AM Date of Discharge: 03/17/20 Attending Physician: Earl Lagos, MD  Discharge Diagnosis: 1. COVID pneumonia  Discharge Medications: Allergies as of 03/17/2020   No Known Allergies     Medication List    TAKE these medications   amLODipine 10 MG tablet Commonly known as: NORVASC Take 1 tablet (10 mg total) by mouth at bedtime.   aspirin EC 81 MG tablet Take 1 tablet by mouth daily.   atorvastatin 20 MG tablet Commonly known as: LIPITOR Take 1 tablet (20 mg total) by mouth daily.   buPROPion 200 MG 12 hr tablet Commonly known as: Wellbutrin SR Take 1 tablet (200 mg total) by mouth 2 (two) times daily.   canagliflozin 100 MG Tabs tablet Commonly known as: INVOKANA Take 1 tablet (100 mg total) by mouth daily.   dexamethasone 6 MG tablet Commonly known as: DECADRON Take 1 tablet (6 mg total) by mouth daily. Start taking on: March 18, 2020   glipiZIDE 5 MG tablet Commonly known as: GLUCOTROL Take 1 tablet (5 mg total) by mouth daily. WITH MEAL   Levemir FlexTouch 100 UNIT/ML FlexPen Generic drug: insulin detemir Inject 50 Units into the skin 2 (two) times daily. Hold dose for BS <150.   levothyroxine 112 MCG tablet Commonly known as: SYNTHROID Take 1 tablet (112 mcg total) by mouth every morning.   losartan-hydrochlorothiazide 100-12.5 MG tablet Commonly known as: HYZAAR Take 1 tablet by mouth daily.   metFORMIN 1000 MG tablet Commonly known as: GLUCOPHAGE Take 1 tablet (1,000 mg total) by mouth 2 (two) times daily with a meal.   metoprolol succinate 25 MG 24 hr tablet Commonly known as: TOPROL-XL Take 1 tablet (25 mg total) by mouth daily.   omeprazole 40 MG capsule Commonly known as: PRILOSEC Take 1 capsule (40 mg total) by mouth daily.   Ozempic (0.25 or 0.5 MG/DOSE) 2 MG/1.5ML Sopn Generic drug: Semaglutide(0.25 or  0.5MG /DOS) Inject 0.5 mg into the skin once a week. Wednesday   sildenafil 100 MG tablet Commonly known as: VIAGRA Take 1 tablet (100 mg total) by mouth as needed for erectile dysfunction.            Durable Medical Equipment  (From admission, onward)         Start     Ordered   03/17/20 1120  DME Oxygen  Once    Question Answer Comment  Length of Need 6 Months   Mode or (Route) Nasal cannula   Liters per Minute 2   Oxygen delivery system Gas      03/17/20 1122          Disposition and follow-up:   Danny Macdonald was discharged from [facility] in Stable condition.  At the hospital follow up visit please address:  1.  COVID pneumonia - Please assess respiratory status and ensure he finished his course of decadron - Please re-evaluate his diabetic regimen as he has been on steroid therapy  2.  Labs / imaging needed at time of follow-up: N/A  3.  Pending labs/ test needing follow-up: N/A  Follow-up Appointments: Follow-up Information    Mendel Ryder, [place]. Call.   Specialty: Family Medicine Contact information: [address] [place] [address] [phone]        [facility] Follow up.   Why: home oxygen  Hospital Course by problem list:  1. COVID Pneumonia: Danny Macdonald is a 49 y.o. with PMH of hypothyroidism, htn, hld, osa who presented to [facility] w/ 3 day history of respiratory distress, fever and cough. He was noted to have positive COVID PCR and increasing oxygen requirement. He was started on Remdesivir and Decadron. On hospital day 1, he developed worsening hypoxic respiratory failure over night and pulmonary and critical care team was consulted. He was started on baricitinib as well and over the course of 7 days we were able to wean down his oxygen requirements.  Discharge Vitals:   BP 108/69 (BP Location: Left Arm)   Pulse 65   Temp 98 F (36.7 C) (Oral)   Resp (!) 21   Ht 5\' 1"  (1.549 m)    Wt 88.5 kg   SpO2 94%   BMI 36.84 kg/m   Pertinent Labs, Studies, and Procedures:  CMP Latest Ref Rng & Units 03/17/2020 03/15/2020 03/14/2020  Glucose 70 - 99 mg/dL 192(H) 159(H) 195(H)  BUN 6 - 20 mg/dL 24(H) 24(H) 25(H)  Creatinine 0.61 - 1.24 mg/dL 0.99 1.07 1.41(H)  Sodium 135 - 145 mmol/L 138 137 140  Potassium 3.5 - 5.1 mmol/L 4.0 3.8 4.2  Chloride 98 - 111 mmol/L 104 103 102  CO2 22 - 32 mmol/L 25 25 25   Calcium 8.9 - 10.3 mg/dL 8.2(L) 8.1(L) 8.2(L)  Total Protein 6.5 - 8.1 g/dL 5.8(L) 6.0(L) 6.0(L)  Total Bilirubin 0.3 - 1.2 mg/dL 0.6 0.6 0.5  Alkaline Phos 38 - 126 U/L 52 50 51  AST 15 - 41 U/L 18 23 27   ALT 0 - 44 U/L 26 25 26    CHEST - 2 VIEW  COMPARISON:  None.  FINDINGS: Low volume chest with streaky opacity at the bases. Normal heart size for technique. No visible effusion or pneumothorax.  IMPRESSION: Low volume chest with streaky opacity at the bases, suspect atypical pneumonia.  Discharge Instructions: Discharge Instructions    Call MD for:  difficulty breathing, headache or visual disturbances   Complete by: As directed    Call MD for:  persistant dizziness or light-headedness   Complete by: As directed    Call MD for:  persistant nausea and vomiting   Complete by: As directed    Call MD for:  severe uncontrolled pain   Complete by: As directed    Call MD for:  temperature >100.4   Complete by: As directed    Diet - low sodium heart healthy   Complete by: As directed    Discharge instructions   Complete by: As directed    Danny Macdonald  You came to [place] with respiratory failure. We have determined this was caused by COVID. Here are our recommendations for you at discharge:  Please continue dexamethasone for 2 more days Please use your home oxygen as needed Please follow up with your primary care provider  Thank you for choosing [facility].   Increase activity slowly   Complete by: As directed      Signed: Mosetta Anis,  MD 03/17/2020, 1:19 PM   Pager: [phone]

## 2020-03-17 NOTE — Progress Notes (Signed)
   Subjective:  Danny Macdonald is a 49 y.o. with PMH of hypothyroidism, htn, hld, osa admit for COVID pneumonia on hospital day 7  Danny Macdonald was examined and evaluated at bedside this am. He states no acute events overnight. Was able to tolerate CPAP without difficulty. Discussed his continued oxygen requirement overnight and need to discharge him on home oxygen. Danny Macdonald expressed understanding and agrees with the plan. Objective:  Vital signs in last 24 hours: Vitals:   03/16/20 1359 03/16/20 2050 03/17/20 0550 03/17/20 0552  BP: 119/76 105/66  107/72  Pulse: 88 65    Resp: 20   (!) 21  Temp: 98.8 F (37.1 C) 98.9 F (37.2 C) 98.1 F (36.7 C)   TempSrc: Oral Oral Oral   SpO2: 94% 92% 94%   Weight:      Height:       Assessment/Plan:  Active Problems:   Pneumonia due to COVID-19 virus  Danny Macdonald is a 49 y.o. with PMH of hypothyroidism, htn, hld, osa admit for acute hypoxic respiratory failure due to COVID pneumonia  Acute hypoxic respiratory failure COVID pneumonia Hospital day 7 for COVID pneumonia. Finished 5-day course of remdesivir. On decadron day 8. On day 7 of baricitinib. LFTs wnl. Was able to sat 90s on RA yesterday but desat with ambulation down to 84 - C/w decadron, baricitinib - O2 as needed to keep sat >85. - Discharge today with home O2  OSA Has hx of OSA w/ CPAP use at home. - CPAP qhs  T2DM Am cbg 97. Elevated bg during day time. Insulin requirement 40 Levemir + 16units novolog - C/w Lantus 40 units BID - C/w Novolog 5 units TID qc - SSI - Glucose checks.  DVT proph: Lovenox Diet: CM, HH Gastric: pantoprazole Bowel: senokot Code: Full  Dispo: Anticipated discharge in approximately today  Theotis Barrio, MD 03/17/2020, 11:13 AM Pager: [phone]

## 2020-12-21 ENCOUNTER — Other Ambulatory Visit: Payer: Self-pay | Admitting: Urology

## 2020-12-21 DIAGNOSIS — R972 Elevated prostate specific antigen [PSA]: Secondary | ICD-10-CM

## 2021-01-30 ENCOUNTER — Ambulatory Visit
Admission: RE | Admit: 2021-01-30 | Discharge: 2021-01-30 | Disposition: A | Discharge status: Home / Self Care | Payer: BC Managed Care – PPO | Source: Ambulatory Visit | Attending: Urology | Admitting: Urology

## 2021-01-30 DIAGNOSIS — R972 Elevated prostate specific antigen [PSA]: Secondary | ICD-10-CM

## 2021-01-30 MED ORDER — GADOBENATE DIMEGLUMINE 529 MG/ML IV SOLN
19.0000 mL | Freq: Once | INTRAVENOUS | Status: AC | PRN
  Administered 2021-01-30: 19 mL via INTRAVENOUS

## 2021-08-28 ENCOUNTER — Ambulatory Visit
Admission: EM | Admit: 2021-08-28 | Discharge: 2021-08-28 | Disposition: A | Discharge status: Home / Self Care | Payer: BC Managed Care – PPO

## 2021-08-28 ENCOUNTER — Encounter: Payer: Self-pay | Admitting: Emergency Medicine

## 2021-08-28 ENCOUNTER — Other Ambulatory Visit: Payer: Self-pay

## 2021-08-28 DIAGNOSIS — H539 Unspecified visual disturbance: Secondary | ICD-10-CM | POA: Diagnosis not present

## 2021-08-28 DIAGNOSIS — I160 Hypertensive urgency: Secondary | ICD-10-CM

## 2021-08-28 NOTE — ED Triage Notes (Signed)
Over the last week vision has become progressively blurred over the last week. States this has not happened before, can't work due to visual changes. Denies headache, weakness, slurred speech syncope. Hx of diabetes, hasn't been treating like he should be due to not being able to afford his meds. Denies eye injury/foreign body

## 2021-08-28 NOTE — Discharge Instructions (Addendum)
Please report to the emergency room now as you will likely need a head CT scan given your vision changes and severely elevated blood pressure readings. Please have your brother drive you there now.

## 2021-08-28 NOTE — ED Provider Notes (Signed)
[facility]   MRN: [id number] DOB: Feb 06, 1971  Subjective:   Danny Macdonald is a 50 y.o. male presenting for 1 week history of persistent worsening vision changes.  Reports that his vision is becoming very blurry and he is having a very difficult time seeing in front of him.  Has not been able to do his job tasks well because of it.  Denies headache, confusion, dizziness, weakness, numbness or tingling, chest pain, shortness of breath, diaphoresis.  No history of stroke, aneurysm, heart disease.  Patient is a diabetic, reports that his blood sugars are well controlled.  He was treated without insulin.  He is very compliant with his blood pressure medications as well.  No current facility-administered medications for this encounter.  Current Outpatient Medications:    amLODipine (NORVASC) 10 MG tablet, Take 1 tablet (10 mg total) by mouth at bedtime., Disp: 90 tablet, Rfl: 1   aspirin EC 81 MG tablet, Take 1 tablet by mouth daily., Disp: , Rfl:    atorvastatin (LIPITOR) 20 MG tablet, Take 1 tablet (20 mg total) by mouth daily., Disp: 90 tablet, Rfl: 1   buPROPion (WELLBUTRIN SR) 200 MG 12 hr tablet, Take 1 tablet (200 mg total) by mouth 2 (two) times daily. (Patient not taking: Reported on 03/10/2020), Disp: 60 tablet, Rfl: 2   canagliflozin (INVOKANA) 100 MG TABS tablet, Take 1 tablet (100 mg total) by mouth daily., Disp: 90 tablet, Rfl: 1   dexamethasone (DECADRON) 6 MG tablet, Take 1 tablet (6 mg total) by mouth daily., Disp: 2 tablet, Rfl: 0   glipiZIDE (GLUCOTROL) 5 MG tablet, Take 1 tablet (5 mg total) by mouth daily. WITH MEAL, Disp: 90 tablet, Rfl: 1   Insulin Detemir (LEVEMIR FLEXTOUCH) 100 UNIT/ML Pen, Inject 50 Units into the skin 2 (two) times daily. Hold dose for BS <150., Disp: 90 mL, Rfl: 1   levothyroxine (SYNTHROID) 112 MCG tablet, Take 1 tablet (112 mcg total) by mouth every morning., Disp: 90 tablet, Rfl: 1   losartan-hydrochlorothiazide (HYZAAR) 100-12.5 MG  tablet, Take 1 tablet by mouth daily., Disp: 90 tablet, Rfl: 1   metFORMIN (GLUCOPHAGE) 1000 MG tablet, Take 1 tablet (1,000 mg total) by mouth 2 (two) times daily with a meal., Disp: 180 tablet, Rfl: 3   metoprolol succinate (TOPROL-XL) 25 MG 24 hr tablet, Take 1 tablet (25 mg total) by mouth daily., Disp: 90 tablet, Rfl: 1   omeprazole (PRILOSEC) 40 MG capsule, Take 1 capsule (40 mg total) by mouth daily., Disp: 90 capsule, Rfl: 1   sildenafil (VIAGRA) 100 MG tablet, Take 1 tablet (100 mg total) by mouth as needed for erectile dysfunction., Disp: 30 tablet, Rfl: 1   No Known Allergies  Past Medical History:  Diagnosis Date   Anxiety    Carpal tunnel syndrome, right    Erectile dysfunction    Essential hypertension    Hyperlipidemia    Hypothyroidism    Left ventricular dysfunction    OSA (obstructive sleep apnea)    Uncontrolled type 2 diabetes mellitus with hyperglycemia (HCC)      History reviewed. No pertinent surgical history.  Family History  Problem Relation Age of Onset   Arthritis Mother    Diabetes Mother    Hypertension Mother    Hypertension Father    Hypertension Sister    Hypertension Brother    Diabetes Maternal Grandmother    Hypertension Maternal Grandmother    Diabetes Maternal Grandfather    Hypertension Maternal Grandfather    Hypertension  Paternal Grandmother    Hypertension Paternal Grandfather    Diabetes Child     Social History   Tobacco Use   Smoking status: Former    Types: Cigarettes    Quit date: 10/26/2013    Years since quitting: 7.8   Smokeless tobacco: Never  Substance Use Topics   Alcohol use: Yes    Comment: ocassionally   Drug use: No    ROS   Objective:   Vitals: BP (!) 176/110 (BP Location: Left Arm)   Pulse 91   Temp 97.7 F (36.5 C) (Oral)   Resp 16   SpO2 98%   BP Readings from Last 3 Encounters:  08/28/21 (!) 176/110  03/17/20 108/69  10/01/19 119/82   Bp recheck was 180/121.  Physical  Exam Constitutional:      General: He is not in acute distress.    Appearance: Normal appearance. He is well-developed. He is not ill-appearing, toxic-appearing or diaphoretic.  HENT:     Head: Normocephalic and atraumatic.     Right Ear: External ear normal.     Left Ear: External ear normal.     Nose: Nose normal.     Mouth/Throat:     Mouth: Mucous membranes are moist.     Pharynx: Oropharynx is clear.  Eyes:     General: No scleral icterus.    Extraocular Movements: Extraocular movements intact.     Pupils: Pupils are equal, round, and reactive to light.  Cardiovascular:     Rate and Rhythm: Normal rate and regular rhythm.     Heart sounds: Normal heart sounds. No murmur heard.   No friction rub. No gallop.  Pulmonary:     Effort: Pulmonary effort is normal. No respiratory distress.     Breath sounds: Normal breath sounds. No stridor. No wheezing, rhonchi or rales.  Neurological:     Mental Status: He is alert and oriented to person, place, and time.     Cranial Nerves: No cranial nerve deficit, dysarthria or facial asymmetry.     Motor: No weakness, tremor, atrophy, abnormal muscle tone, seizure activity or pronator drift.     Coordination: Romberg sign negative. Coordination normal. Finger-Nose-Finger Test and Heel to [facility] Test normal. Rapid alternating movements normal.     Gait: Gait normal.  Psychiatric:        Mood and Affect: Mood normal.        Behavior: Behavior normal.        Thought Content: Thought content normal.    Assessment and Plan :   PDMP not reviewed this encounter.  1. Vision changes   2. Hypertensive urgency     Vision changes are concerning in the context of severely elevated blood pressure where he has normally really good control of his blood pressure.  Recommended further evaluation and intervention including consideration for head CT scan in the emergency room setting.  Patient is agreeable to this, will have his brother drive him to the  hospital now.   Wallis Bamberg, PA-C 08/28/21 1349

## 2021-09-04 ENCOUNTER — Encounter (HOSPITAL_COMMUNITY): Payer: Self-pay | Admitting: *Deleted

## 2021-09-04 ENCOUNTER — Emergency Department (HOSPITAL_COMMUNITY)
Admission: EM | Admit: 2021-09-04 | Discharge: 2021-09-05 | Disposition: A | Discharge status: Home / Self Care | Payer: BC Managed Care – PPO | Attending: Emergency Medicine | Admitting: Emergency Medicine

## 2021-09-04 ENCOUNTER — Other Ambulatory Visit: Payer: Self-pay

## 2021-09-04 DIAGNOSIS — H538 Other visual disturbances: Secondary | ICD-10-CM | POA: Insufficient documentation

## 2021-09-04 DIAGNOSIS — R739 Hyperglycemia, unspecified: Secondary | ICD-10-CM | POA: Diagnosis not present

## 2021-09-04 DIAGNOSIS — R350 Frequency of micturition: Secondary | ICD-10-CM | POA: Insufficient documentation

## 2021-09-04 DIAGNOSIS — Z5321 Procedure and treatment not carried out due to patient leaving prior to being seen by health care provider: Secondary | ICD-10-CM | POA: Diagnosis not present

## 2021-09-04 DIAGNOSIS — R519 Headache, unspecified: Secondary | ICD-10-CM | POA: Insufficient documentation

## 2021-09-04 LAB — CBC WITH DIFFERENTIAL/PLATELET
Abs Immature Granulocytes: 0.02 10*3/uL (ref 0.00–0.07)
Basophils Absolute: 0 10*3/uL (ref 0.0–0.1)
Basophils Relative: 0 %
Eosinophils Absolute: 0 10*3/uL (ref 0.0–0.5)
Eosinophils Relative: 1 %
HCT: 45.6 % (ref 39.0–52.0)
Hemoglobin: 15.4 g/dL (ref 13.0–17.0)
Immature Granulocytes: 0 %
Lymphocytes Relative: 33 %
Lymphs Abs: 2.3 10*3/uL (ref 0.7–4.0)
MCH: 29.6 pg (ref 26.0–34.0)
MCHC: 33.8 g/dL (ref 30.0–36.0)
MCV: 87.7 fL (ref 80.0–100.0)
Monocytes Absolute: 0.5 10*3/uL (ref 0.1–1.0)
Monocytes Relative: 7 %
Neutro Abs: 4.1 10*3/uL (ref 1.7–7.7)
Neutrophils Relative %: 59 %
Platelets: 291 10*3/uL (ref 150–400)
RBC: 5.2 MIL/uL (ref 4.22–5.81)
RDW: 12.8 % (ref 11.5–15.5)
WBC: 7 10*3/uL (ref 4.0–10.5)
nRBC: 0 % (ref 0.0–0.2)

## 2021-09-04 LAB — BASIC METABOLIC PANEL
Anion gap: 13 (ref 5–15)
BUN: 16 mg/dL (ref 6–20)
CO2: 22 mmol/L (ref 22–32)
Calcium: 9.3 mg/dL (ref 8.9–10.3)
Chloride: 95 mmol/L — ABNORMAL LOW (ref 98–111)
Creatinine, Ser: 1.12 mg/dL (ref 0.61–1.24)
GFR, Estimated: >60 mL/min (ref >60)
Glucose, Bld: 608 mg/dL (ref 70–99)
Potassium: 4.5 mmol/L (ref 3.5–5.1)
Sodium: 130 mmol/L — ABNORMAL LOW (ref 135–145)

## 2021-09-04 LAB — CBG MONITORING, ED: Glucose-Capillary: >600 mg/dL (ref 70–99)

## 2021-09-04 NOTE — ED Triage Notes (Signed)
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 Reports unable to afford Humalog due to price.

## 2021-09-04 NOTE — ED Provider Notes (Signed)
Emergency Medicine Provider Triage Evaluation Note  Danny Macdonald , a 50 y.o. male  was evaluated in triage.  Pt complains of increased urination, blurry vision, headache.  Patient is a Public relations account executive.  He states he has been having difficulty driving due to his vision.  No loss of vision, just blurry.  No vomiting or abdominal pain.  He states that he has to stop a couple times an hour to urinate.  He is taking his oral diabetes medications.  He has been out of his insulin for several months because he cannot afford the deductible.  He denies fevers, URI symptoms, chest pain or shortness of breath.  Review of Systems  Positive: Blurry vision, frequent urination Negative: Fever  Physical Exam  BP (!) 145/94 (BP Location: Right Arm)   Pulse 93   Temp 97.7 F (36.5 C) (Oral)   Resp 14   SpO2 98%  Gen:   Awake, no distress   Resp:  Normal effort  MSK:   Moves extremities without difficulty  Other:    Medical Decision Making  Medically screening exam initiated at 9:59 PM.  Appropriate orders placed.  Danny Macdonald was informed that the remainder of the evaluation will be completed by another provider, this initial triage assessment does not replace that evaluation, and the importance of remaining in the ED until their evaluation is complete.     Renne Crigler, PA-C 09/04/21 2200    Milagros Loll, MD 09/05/21 [phone]

## 2021-09-05 LAB — URINALYSIS, ROUTINE W REFLEX MICROSCOPIC
Bacteria, UA: NONE SEEN
Bilirubin Urine: NEGATIVE
Glucose, UA: >=500 mg/dL — AB
Hgb urine dipstick: NEGATIVE
Ketones, ur: 5 mg/dL — AB
Leukocytes,Ua: NEGATIVE
Nitrite: NEGATIVE
Protein, ur: NEGATIVE mg/dL
Specific Gravity, Urine: 1.028 (ref 1.005–1.030)
pH: 6 (ref 5.0–8.0)

## 2021-09-05 LAB — CBG MONITORING, ED: Glucose-Capillary: 426 mg/dL — ABNORMAL HIGH (ref 70–99)

## 2021-09-05 NOTE — ED Notes (Signed)
Called pt 3x, no response.  

## 2021-09-11 ENCOUNTER — Emergency Department (HOSPITAL_COMMUNITY)
Admission: EM | Admit: 2021-09-11 | Discharge: 2021-09-11 | Disposition: A | Discharge status: Home / Self Care | Payer: BC Managed Care – PPO | Attending: Emergency Medicine | Admitting: Emergency Medicine

## 2021-09-11 ENCOUNTER — Other Ambulatory Visit (HOSPITAL_COMMUNITY): Payer: Self-pay

## 2021-09-11 ENCOUNTER — Other Ambulatory Visit: Payer: Self-pay

## 2021-09-11 DIAGNOSIS — Z7982 Long term (current) use of aspirin: Secondary | ICD-10-CM | POA: Diagnosis not present

## 2021-09-11 DIAGNOSIS — E039 Hypothyroidism, unspecified: Secondary | ICD-10-CM | POA: Diagnosis not present

## 2021-09-11 DIAGNOSIS — Z7984 Long term (current) use of oral hypoglycemic drugs: Secondary | ICD-10-CM | POA: Insufficient documentation

## 2021-09-11 DIAGNOSIS — Z79899 Other long term (current) drug therapy: Secondary | ICD-10-CM | POA: Insufficient documentation

## 2021-09-11 DIAGNOSIS — I10 Essential (primary) hypertension: Secondary | ICD-10-CM | POA: Diagnosis not present

## 2021-09-11 DIAGNOSIS — H538 Other visual disturbances: Secondary | ICD-10-CM | POA: Diagnosis present

## 2021-09-11 DIAGNOSIS — R739 Hyperglycemia, unspecified: Secondary | ICD-10-CM

## 2021-09-11 DIAGNOSIS — Z87891 Personal history of nicotine dependence: Secondary | ICD-10-CM | POA: Diagnosis not present

## 2021-09-11 DIAGNOSIS — E1165 Type 2 diabetes mellitus with hyperglycemia: Secondary | ICD-10-CM | POA: Insufficient documentation

## 2021-09-11 DIAGNOSIS — Z794 Long term (current) use of insulin: Secondary | ICD-10-CM | POA: Diagnosis not present

## 2021-09-11 LAB — URINALYSIS, ROUTINE W REFLEX MICROSCOPIC
Bacteria, UA: NONE SEEN
Bilirubin Urine: NEGATIVE
Glucose, UA: >=500 mg/dL — AB
Hgb urine dipstick: NEGATIVE
Ketones, ur: 5 mg/dL — AB
Leukocytes,Ua: NEGATIVE
Nitrite: NEGATIVE
Protein, ur: NEGATIVE mg/dL
Specific Gravity, Urine: 1.025 (ref 1.005–1.030)
pH: 7 (ref 5.0–8.0)

## 2021-09-11 LAB — BASIC METABOLIC PANEL
Anion gap: 11 (ref 5–15)
BUN: 15 mg/dL (ref 6–20)
CO2: 23 mmol/L (ref 22–32)
Calcium: 8.4 mg/dL — ABNORMAL LOW (ref 8.9–10.3)
Chloride: 97 mmol/L — ABNORMAL LOW (ref 98–111)
Creatinine, Ser: 1.03 mg/dL (ref 0.61–1.24)
GFR, Estimated: >60 mL/min (ref >60)
Glucose, Bld: 528 mg/dL (ref 70–99)
Potassium: 3.9 mmol/L (ref 3.5–5.1)
Sodium: 131 mmol/L — ABNORMAL LOW (ref 135–145)

## 2021-09-11 LAB — I-STAT VENOUS BLOOD GAS, ED
Acid-Base Excess: 4 mmol/L — ABNORMAL HIGH (ref 0.0–2.0)
Bicarbonate: 27.7 mmol/L (ref 20.0–28.0)
Calcium, Ion: 1.11 mmol/L — ABNORMAL LOW (ref 1.15–1.40)
HCT: 40 % (ref 39.0–52.0)
Hemoglobin: 13.6 g/dL (ref 13.0–17.0)
O2 Saturation: 87 %
Potassium: 4.2 mmol/L (ref 3.5–5.1)
Sodium: 136 mmol/L (ref 135–145)
TCO2: 29 mmol/L (ref 22–32)
pCO2, Ven: 39.5 mmHg — ABNORMAL LOW (ref 44.0–60.0)
pH, Ven: 7.454 — ABNORMAL HIGH (ref 7.250–7.430)
pO2, Ven: 51 mmHg — ABNORMAL HIGH (ref 32.0–45.0)

## 2021-09-11 LAB — OSMOLALITY: Osmolality: 309 mOsm/kg — ABNORMAL HIGH (ref 275–295)

## 2021-09-11 LAB — CBC
HCT: 40.2 % (ref 39.0–52.0)
Hemoglobin: 14 g/dL (ref 13.0–17.0)
MCH: 30.4 pg (ref 26.0–34.0)
MCHC: 34.8 g/dL (ref 30.0–36.0)
MCV: 87.2 fL (ref 80.0–100.0)
Platelets: 254 10*3/uL (ref 150–400)
RBC: 4.61 MIL/uL (ref 4.22–5.81)
RDW: 12.9 % (ref 11.5–15.5)
WBC: 6.2 10*3/uL (ref 4.0–10.5)
nRBC: 0 % (ref 0.0–0.2)

## 2021-09-11 LAB — CBG MONITORING, ED
Glucose-Capillary: 376 mg/dL — ABNORMAL HIGH (ref 70–99)
Glucose-Capillary: 332 mg/dL — ABNORMAL HIGH (ref 70–99)
Glucose-Capillary: 268 mg/dL — ABNORMAL HIGH (ref 70–99)

## 2021-09-11 LAB — BETA-HYDROXYBUTYRIC ACID: Beta-Hydroxybutyric Acid: 0.2 mmol/L (ref 0.05–0.27)

## 2021-09-11 MED ORDER — SODIUM CHLORIDE 0.9 % IV BOLUS
1000.0000 mL | Freq: Once | INTRAVENOUS | Status: AC
  Administered 2021-09-11: 1000 mL via INTRAVENOUS

## 2021-09-11 MED ORDER — INSULIN ASPART 100 UNIT/ML IJ SOLN
8.0000 [IU] | Freq: Once | INTRAMUSCULAR | Status: AC
  Administered 2021-09-11: 8 [IU] via SUBCUTANEOUS
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
## 2021-09-11 NOTE — Progress Notes (Addendum)
Inpatient Diabetes Program Recommendations  AACE/ADA: New Consensus Statement on Inpatient Glycemic Control (2015)  Target Ranges:  Prepandial:   less than 140 mg/dL      Peak postprandial:   less than 180 mg/dL (1-2 hours)      Critically ill patients:  140 - 180 mg/dL   Lab Results  Component Value Date   GLUCAP 426 (H) 09/05/2021   HGBA1C 8.8 (H) 03/10/2020    Review of Glycemic Control  Diabetes history: DM2 Outpatient Diabetes medications: None @ time of ED admission Current orders for Inpatient glycemic control: None  Inpatient Diabetes Program Recommendations:   Ozempic is not preferred from pt's insurance list. Trulicity is listed as preferred.  Spoke with pt. Via phone to discuss medications. Patient states he stopped taking Metformin due to severe diarrhea. Patient was discontinued off of insulin while on Ozempic but discontinued due to cost. Sees endocrinology NP [facility] with last office visit November 2021. Patient had called office today requesting appointment and no appointment available until November 2022. Patient had been on Glipizide 5 mg bid, Metformin 1 gm bid, Invocana 300 mg qd, Levemir 50 units q d, Ozempic 0.25 to 1 mg qweek. Spoke with NP [facility] nurse Gilda and received the above medication list and asked office to call pt. With office visit appointment information and set time with CDE. If pt. To be on insulin, Novolin Relion Insulin would be approximately $43 for box of 5 insulin pens (total 1500 units). Transition of care consult to review cost of medications.  Thank you, Danny Macdonald. Zailyn Thoennes, RN, MSN, CDE  Diabetes Coordinator Inpatient Glycemic Control Team Team Pager [phone] (8am-5pm) 09/11/2021 1:56 PM

## 2021-09-11 NOTE — TOC Benefit Eligibility Note (Signed)
Patient Product/process development scientist completed.    The patient is currently admitted and upon discharge could be taking Trulicity 0.75 mg/0.5 ml.  The current 30 day co-pay is, $25.00, LILLY, the mfr of TRULICITY 0.75 MG/0.5 ML PN paid 146.16 toward plan copay.   The patient is insured through [employer]     Danny Macdonald, CPhT Pharmacy Patient Advocate Specialist [facility] Health Antimicrobial Stewardship Team Direct Number: [phone]  Fax: [phone]

## 2021-09-11 NOTE — ED Notes (Signed)
Dc instructions reviewed with pt. PT verbalized understanding. PT DC 

## 2021-09-11 NOTE — ED Notes (Signed)
Patient states since changing jobs last year his insurance no longer sufficiently covers the majority of diabetic medications other than metformin and so he has not taken them since April 2022.

## 2021-09-11 NOTE — ED Triage Notes (Signed)
Pt. Stated, Im a diabetic , My eyes are blurry and urinated a lot. I cant afford my medication. I called my Dr. And even the Metformin makes me sick.

## 2021-09-11 NOTE — ED Triage Notes (Signed)
528 Glucose

## 2021-09-11 NOTE — Discharge Instructions (Addendum)
You were seen in the ER today for your high blood sugar.  Your physical exam, vital signs, and blood work were reassuring. Please follow-up with your endocrinologist as discussed for 2 days from now.  Regarding your medications you prefer they be managed by your endocrinologist.  Please discuss with them regarding refill and further management of your diabetes medications on Wednesday.  Additionally below is contact information for the [facility] with whom you may follow-up as needed for resources with financial assistance for healthcare.  Return to the ER with any new severe symptoms.

## 2021-11-25 ENCOUNTER — Encounter (HOSPITAL_COMMUNITY): Payer: Self-pay

## 2021-11-25 ENCOUNTER — Other Ambulatory Visit: Payer: Self-pay

## 2021-11-25 ENCOUNTER — Emergency Department (HOSPITAL_COMMUNITY)
Admission: EM | Admit: 2021-11-25 | Discharge: 2021-11-25 | Disposition: A | Discharge status: Home / Self Care | Payer: BC Managed Care – PPO | Attending: Emergency Medicine | Admitting: Emergency Medicine

## 2021-11-25 DIAGNOSIS — Z7984 Long term (current) use of oral hypoglycemic drugs: Secondary | ICD-10-CM | POA: Insufficient documentation

## 2021-11-25 DIAGNOSIS — I10 Essential (primary) hypertension: Secondary | ICD-10-CM | POA: Diagnosis not present

## 2021-11-25 DIAGNOSIS — Z794 Long term (current) use of insulin: Secondary | ICD-10-CM | POA: Insufficient documentation

## 2021-11-25 DIAGNOSIS — Z79899 Other long term (current) drug therapy: Secondary | ICD-10-CM | POA: Diagnosis not present

## 2021-11-25 DIAGNOSIS — Z87891 Personal history of nicotine dependence: Secondary | ICD-10-CM | POA: Diagnosis not present

## 2021-11-25 DIAGNOSIS — E039 Hypothyroidism, unspecified: Secondary | ICD-10-CM | POA: Insufficient documentation

## 2021-11-25 DIAGNOSIS — E1165 Type 2 diabetes mellitus with hyperglycemia: Secondary | ICD-10-CM | POA: Insufficient documentation

## 2021-11-25 DIAGNOSIS — R7309 Other abnormal glucose: Secondary | ICD-10-CM

## 2021-11-25 LAB — CBC WITH DIFFERENTIAL/PLATELET
Abs Immature Granulocytes: 0.03 10*3/uL (ref 0.00–0.07)
Basophils Absolute: 0 10*3/uL (ref 0.0–0.1)
Basophils Relative: 0 %
Eosinophils Absolute: 0 10*3/uL (ref 0.0–0.5)
Eosinophils Relative: 0 %
HCT: 47.9 % (ref 39.0–52.0)
Hemoglobin: 16.1 g/dL (ref 13.0–17.0)
Immature Granulocytes: 0 %
Lymphocytes Relative: 24 %
Lymphs Abs: 1.7 10*3/uL (ref 0.7–4.0)
MCH: 29.9 pg (ref 26.0–34.0)
MCHC: 33.6 g/dL (ref 30.0–36.0)
MCV: 89 fL (ref 80.0–100.0)
Monocytes Absolute: 0.3 10*3/uL (ref 0.1–1.0)
Monocytes Relative: 4 %
Neutro Abs: 5.1 10*3/uL (ref 1.7–7.7)
Neutrophils Relative %: 72 %
Platelets: 226 10*3/uL (ref 150–400)
RBC: 5.38 MIL/uL (ref 4.22–5.81)
RDW: 12.4 % (ref 11.5–15.5)
WBC: 7.2 10*3/uL (ref 4.0–10.5)
nRBC: 0 % (ref 0.0–0.2)

## 2021-11-25 LAB — COMPREHENSIVE METABOLIC PANEL
ALT: 19 U/L (ref 0–44)
AST: 21 U/L (ref 15–41)
Albumin: 3.7 g/dL (ref 3.5–5.0)
Alkaline Phosphatase: 99 U/L (ref 38–126)
Anion gap: 11 (ref 5–15)
BUN: 18 mg/dL (ref 6–20)
CO2: 24 mmol/L (ref 22–32)
Calcium: 8.9 mg/dL (ref 8.9–10.3)
Chloride: 95 mmol/L — ABNORMAL LOW (ref 98–111)
Creatinine, Ser: 1.3 mg/dL — ABNORMAL HIGH (ref 0.61–1.24)
GFR, Estimated: >60 mL/min (ref >60)
Glucose, Bld: 687 mg/dL (ref 70–99)
Potassium: 4.2 mmol/L (ref 3.5–5.1)
Sodium: 130 mmol/L — ABNORMAL LOW (ref 135–145)
Total Bilirubin: 0.7 mg/dL (ref 0.3–1.2)
Total Protein: 7 g/dL (ref 6.5–8.1)

## 2021-11-25 LAB — CBG MONITORING, ED
Glucose-Capillary: >600 mg/dL (ref 70–99)
Glucose-Capillary: 405 mg/dL — ABNORMAL HIGH (ref 70–99)
Glucose-Capillary: 275 mg/dL — ABNORMAL HIGH (ref 70–99)

## 2021-11-25 MED ORDER — LACTATED RINGERS IV SOLN: INTRAVENOUS | Status: DC

## 2021-11-25 MED ORDER — LACTATED RINGERS IV BOLUS
1000.0000 mL | Freq: Once | INTRAVENOUS | Status: AC
  Administered 2021-11-25: 10:00:00 1000 mL via INTRAVENOUS

## 2021-11-25 MED ORDER — LACTATED RINGERS IV BOLUS
1000.0000 mL | Freq: Once | INTRAVENOUS | Status: AC
  Administered 2021-11-25: 08:00:00 1000 mL via INTRAVENOUS

## 2021-11-25 MED ORDER — INSULIN ASPART 100 UNIT/ML IJ SOLN
10.0000 [IU] | Freq: Once | INTRAMUSCULAR | Status: AC
  Administered 2021-11-25: 08:00:00 10 [IU] via SUBCUTANEOUS
  Filled 2021-11-25: qty 0.1
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
## 2021-11-25 NOTE — ED Provider Notes (Signed)
[facility]-EMERGENCY DEPT Provider Note   CSN: [id number] Arrival date & time: 11/25/21  7902     History Chief Complaint  Patient presents with   Cold Exposure    Danny Macdonald is a 50 y.o. male.  50 year old male who presents complaining of being cold and concern for blood sugar.  Patient lives in his car at this time.  States that his car may have a gas.  Notes he has not been compliant with his blood pressure medication or oral hypoglycemics.  Denies any emesis.  No other complaints      Past Medical History:  Diagnosis Date   Anxiety    Carpal tunnel syndrome, right    Erectile dysfunction    Essential hypertension    Hyperlipidemia    Hypothyroidism    Left ventricular dysfunction    OSA (obstructive sleep apnea)    Uncontrolled type 2 diabetes mellitus with hyperglycemia [facility])     Patient Active Problem List   Diagnosis Date Noted   Pneumonia due to COVID-19 virus 03/10/2020   BMI 37.0-37.9, adult 02/20/2018   Left ventricular dysfunction 01/23/2018   Anxiety disorder 10/14/2017   Carpal tunnel syndrome of right wrist 10/14/2017   Essential (primary) hypertension 10/14/2017   Hyperlipidemia 10/14/2017   Hypothyroidism 10/14/2017   Male erectile dysfunction, unspecified 10/14/2017   Nasal septal deviation 07/11/2016   Anosmia 01/17/2016    History reviewed. No pertinent surgical history.     Family History  Problem Relation Age of Onset   Arthritis Mother    Diabetes Mother    Hypertension Mother    Hypertension Father    Hypertension Sister    Hypertension Brother    Diabetes Maternal Grandmother    Hypertension Maternal Grandmother    Diabetes Maternal Grandfather    Hypertension Maternal Grandfather    Hypertension Paternal Grandmother    Hypertension Paternal Grandfather    Diabetes Child     Social History   Tobacco Use   Smoking status: Former    Types: Cigarettes    Quit date: 10/26/2013    Years since  quitting: 8.0   Smokeless tobacco: Never  Substance Use Topics   Alcohol use: Yes    Comment: ocassionally   Drug use: No    Home Medications Prior to Admission medications   Medication Sig Start Date End Date Taking? Authorizing Provider  amLODipine (NORVASC) 10 MG tablet Take 1 tablet (10 mg total) by mouth at bedtime. 10/01/19   Hoy Register, MD  aspirin EC 81 MG tablet Take 1 tablet by mouth daily. 11/11/18   [provider]  atorvastatin (LIPITOR) 20 MG tablet Take 1 tablet (20 mg total) by mouth daily. Patient not taking: Reported on 09/11/2021 10/01/19   Hoy Register, MD  atorvastatin (LIPITOR) 40 MG tablet Take 40 mg by mouth daily. 11/10/20   [provider]  buPROPion (WELLBUTRIN SR) 200 MG 12 hr tablet Take 1 tablet (200 mg total) by mouth 2 (two) times daily. Patient not taking: No sig reported 10/01/19   Hoy Register, MD  canagliflozin (INVOKANA) 100 MG TABS tablet Take 1 tablet (100 mg total) by mouth daily. Patient not taking: Reported on 09/11/2021 10/01/19   Hoy Register, MD  dexamethasone (DECADRON) 6 MG tablet Take 1 tablet (6 mg total) by mouth daily. Patient not taking: No sig reported 03/18/20   Theotis Barrio, MD  fluticasone [facility]) 50 MCG/ACT nasal spray Place 1 spray into both nostrils daily.    [provider]  glipiZIDE (GLUCOTROL) 5 MG tablet Take 1 tablet (5 mg total) by mouth daily. WITH MEAL 10/01/19   Hoy Register, MD  Insulin Detemir (LEVEMIR FLEXTOUCH) 100 UNIT/ML Pen Inject 50 Units into the skin 2 (two) times daily. Hold dose for BS <150. 10/01/19   Hoy Register, MD  insulin lispro (HUMALOG) 100 UNIT/ML KwikPen Inject 8 Units into the skin in the morning, at noon, and at bedtime. 10/24/20   [provider]  levothyroxine (SYNTHROID) 112 MCG tablet Take 1 tablet (112 mcg total) by mouth every morning. Patient not taking: Reported on 09/11/2021 10/02/19   Hoy Register, MD   losartan-hydrochlorothiazide (HYZAAR) 100-12.5 MG tablet Take 1 tablet by mouth daily. 10/01/19   Hoy Register, MD  metFORMIN (GLUCOPHAGE) 1000 MG tablet Take 1 tablet (1,000 mg total) by mouth 2 (two) times daily with a meal. 10/01/19   Hoy Register, MD  metoprolol succinate (TOPROL-XL) 25 MG 24 hr tablet Take 1 tablet (25 mg total) by mouth daily. 10/01/19   Hoy Register, MD  Semaglutide, 1 MG/DOSE, (OZEMPIC, 1 MG/DOSE,) 4 MG/3ML SOPN Inject 1 mg into the skin once a week.    [provider]  sildenafil (VIAGRA) 100 MG tablet Take 1 tablet (100 mg total) by mouth as needed for erectile dysfunction. 04/29/19   Bing Neighbors, FNP    Allergies    Patient has no known allergies.  Review of Systems   Review of Systems  All other systems reviewed and are negative.  Physical Exam Updated Vital Signs BP (!) 167/110 (BP Location: Left Arm)    Pulse (!) 101    Temp 97.6 F (36.4 C) (Oral)    Resp 18    Ht 1.549 m (5\' 1" )    Wt 77.1 kg    SpO2 96%    BMI 32.12 kg/m   Physical Exam Vitals and nursing note reviewed.  Constitutional:      General: He is not in acute distress.    Appearance: Normal appearance. He is well-developed. He is not toxic-appearing.  HENT:     Head: Normocephalic and atraumatic.  Eyes:     General: Lids are normal.     Conjunctiva/sclera: Conjunctivae normal.     Pupils: Pupils are equal, round, and reactive to light.  Neck:     Thyroid: No thyroid mass.     Trachea: No tracheal deviation.  Cardiovascular:     Rate and Rhythm: Normal rate and regular rhythm.     Heart sounds: Normal heart sounds. No murmur heard.   No gallop.  Pulmonary:     Effort: Pulmonary effort is normal. No respiratory distress.     Breath sounds: Normal breath sounds. No stridor. No decreased breath sounds, wheezing, rhonchi or rales.  Abdominal:     General: There is no distension.     Palpations: Abdomen is soft.     Tenderness: There is no abdominal  tenderness. There is no rebound.  Musculoskeletal:        General: No tenderness. Normal range of motion.     Cervical back: Normal range of motion and neck supple.  Skin:    General: Skin is warm and dry.     Findings: No abrasion or rash.  Neurological:     Mental Status: He is alert and oriented to person, place, and time. Mental status is at baseline.     GCS: GCS eye subscore is 4. GCS verbal subscore is 5. GCS motor subscore is 6.  Cranial Nerves: No cranial nerve deficit.     Sensory: No sensory deficit.     Motor: Motor function is intact.  Psychiatric:        Attention and Perception: Attention normal.        Speech: Speech normal.        Behavior: Behavior normal.    ED Results / Procedures / Treatments   Labs (all labs ordered are listed, but only abnormal results are displayed) Labs Reviewed  CBG MONITORING, ED - Abnormal; Notable for the following components:      Result Value   Glucose-Capillary >600 (*)    All other components within normal limits  CBC WITH DIFFERENTIAL/PLATELET  COMPREHENSIVE METABOLIC PANEL    EKG None  Radiology No results found.  Procedures Procedures   Medications Ordered in ED Medications  lactated ringers bolus 1,000 mL (has no administration in time range)  lactated ringers infusion (has no administration in time range)  insulin aspart (novoLOG) injection 10 Units (has no administration in time range)    ED Course  I have reviewed the triage vital signs and the nursing notes.  Pertinent labs & imaging results that were available during my care of the patient were reviewed by me and considered in my medical decision making (see chart for details).    MDM Rules/Calculators/A&P                         Patient here hyperglycemic and given IV fluids.  He has no evidence of DKA.  He is mentating appropriately and blood sugar retractable times and has improved after receiving insulin as well.  States he has refills of his  medications.  Will be discharged    Final Clinical Impression(s) / ED Diagnoses Final diagnoses:  None    Rx / DC Orders ED Discharge Orders     None        Lorre Nick, MD 11/25/21 1120

## 2021-11-25 NOTE — ED Notes (Signed)
Pt reports that his diabetes and HTN medications are elsewhere and not with him.

## 2021-11-25 NOTE — ED Notes (Signed)
Patient getting dressed in room.

## 2021-11-25 NOTE — ED Notes (Signed)
Pt. CBG greater than 600 on gluometer, RN, TJ made aware.

## 2021-12-11 ENCOUNTER — Emergency Department (HOSPITAL_COMMUNITY): Payer: BC Managed Care – PPO

## 2021-12-11 ENCOUNTER — Emergency Department (HOSPITAL_COMMUNITY)
Admission: EM | Admit: 2021-12-11 | Discharge: 2021-12-12 | Disposition: A | Discharge status: Home / Self Care | Payer: BC Managed Care – PPO | Attending: Emergency Medicine | Admitting: Emergency Medicine

## 2021-12-11 ENCOUNTER — Encounter (HOSPITAL_COMMUNITY): Payer: Self-pay | Admitting: Emergency Medicine

## 2021-12-11 DIAGNOSIS — E1065 Type 1 diabetes mellitus with hyperglycemia: Secondary | ICD-10-CM | POA: Diagnosis not present

## 2021-12-11 DIAGNOSIS — H539 Unspecified visual disturbance: Secondary | ICD-10-CM

## 2021-12-11 DIAGNOSIS — Z7982 Long term (current) use of aspirin: Secondary | ICD-10-CM | POA: Diagnosis not present

## 2021-12-11 DIAGNOSIS — R739 Hyperglycemia, unspecified: Secondary | ICD-10-CM

## 2021-12-11 DIAGNOSIS — Y900 Blood alcohol level of less than 20 mg/100 ml: Secondary | ICD-10-CM | POA: Insufficient documentation

## 2021-12-11 DIAGNOSIS — Z7984 Long term (current) use of oral hypoglycemic drugs: Secondary | ICD-10-CM | POA: Diagnosis not present

## 2021-12-11 DIAGNOSIS — H538 Other visual disturbances: Secondary | ICD-10-CM | POA: Insufficient documentation

## 2021-12-11 LAB — COMPREHENSIVE METABOLIC PANEL
ALT: 23 U/L (ref 0–44)
AST: 20 U/L (ref 15–41)
Albumin: 4 g/dL (ref 3.5–5.0)
Alkaline Phosphatase: 134 U/L — ABNORMAL HIGH (ref 38–126)
Anion gap: 11 (ref 5–15)
BUN: 12 mg/dL (ref 6–20)
CO2: 29 mmol/L (ref 22–32)
Calcium: 9.7 mg/dL (ref 8.9–10.3)
Chloride: 96 mmol/L — ABNORMAL LOW (ref 98–111)
Creatinine, Ser: 1.51 mg/dL — ABNORMAL HIGH (ref 0.61–1.24)
GFR, Estimated: 56 mL/min — ABNORMAL LOW (ref >60)
Glucose, Bld: 521 mg/dL (ref 70–99)
Potassium: 3.8 mmol/L (ref 3.5–5.1)
Sodium: 136 mmol/L (ref 135–145)
Total Bilirubin: 0.6 mg/dL (ref 0.3–1.2)
Total Protein: 7.6 g/dL (ref 6.5–8.1)

## 2021-12-11 LAB — CBC WITH DIFFERENTIAL/PLATELET
Abs Immature Granulocytes: 0.01 10*3/uL (ref 0.00–0.07)
Basophils Absolute: 0 10*3/uL (ref 0.0–0.1)
Basophils Relative: 0 %
Eosinophils Absolute: 0 10*3/uL (ref 0.0–0.5)
Eosinophils Relative: 1 %
HCT: 49 % (ref 39.0–52.0)
Hemoglobin: 16.2 g/dL (ref 13.0–17.0)
Immature Granulocytes: 0 %
Lymphocytes Relative: 20 %
Lymphs Abs: 1.3 10*3/uL (ref 0.7–4.0)
MCH: 29.7 pg (ref 26.0–34.0)
MCHC: 33.1 g/dL (ref 30.0–36.0)
MCV: 89.9 fL (ref 80.0–100.0)
Monocytes Absolute: 0.5 10*3/uL (ref 0.1–1.0)
Monocytes Relative: 7 %
Neutro Abs: 4.5 10*3/uL (ref 1.7–7.7)
Neutrophils Relative %: 72 %
Platelets: 254 10*3/uL (ref 150–400)
RBC: 5.45 MIL/uL (ref 4.22–5.81)
RDW: 12 % (ref 11.5–15.5)
WBC: 6.2 10*3/uL (ref 4.0–10.5)
nRBC: 0 % (ref 0.0–0.2)

## 2021-12-11 LAB — CBG MONITORING, ED: Glucose-Capillary: 508 mg/dL (ref 70–99)

## 2021-12-11 LAB — OSMOLALITY: Osmolality: 316 mOsm/kg — ABNORMAL HIGH (ref 275–295)

## 2021-12-11 LAB — ETHANOL: Alcohol, Ethyl (B): <10 mg/dL (ref <10)

## 2021-12-11 NOTE — ED Triage Notes (Addendum)
Patient with history of diabetes BIB [facility] for evaluation of progressively worsening blurred vision since 0700 this morning. CBG 500 with EMS. Patient reports headache, has no other complaints at this time. 300 mL NS through 18g in left forearm from EMS PTA.

## 2021-12-11 NOTE — ED Provider Triage Note (Addendum)
Emergency Medicine Provider Triage Evaluation Note  Danny Macdonald , a 51 y.o. male  was evaluated in triage.  Pt complains of bilateral vision loss.  States that 3 to 4 hours prior he started having vision loss to both eyes.  States that both eyes are affected and there is not 1 area where vision loss is however is generalized throughout his entire eye.  Denies any recent falls or injuries.  Patient reports that he sometimes takes his medication for diabetes.  Review of Systems  Positive: Vision loss, Negative: Numbness, weakness, facial asymmetry, dysarthria, headache,  Physical Exam  BP (!) 151/97    Pulse 100    Temp 99 F (37.2 C) (Oral)    Resp 16    SpO2 96%  Gen:   Awake, no distress   Resp:  Normal effort  MSK:   Moves extremities without difficulty  Other:  EOM intact bilaterally, pupils PERRL.  CN II through XII intact.  Negative pronator drift.  Sensation to light touch grossly intact to bilateral upper and lower extremities.  Medical Decision Making  Medically screening exam initiated at 4:34 PM.  Appropriate orders placed.  Nahome Loconte was informed that the remainder of the evaluation will be completed by another provider, this initial triage assessment does not replace that evaluation, and the importance of remaining in the ED until their evaluation is complete.  Clinical Course as of 12/11/21 2334  Mon Dec 11, 2021  1833 Contacted by lab with reports that glucose is 521 [PB]    Clinical Course User Index [PB] Joya Martyr, PA-C 12/11/21 1636    Haskel Schroeder, PA-C 12/11/21 2334

## 2021-12-12 ENCOUNTER — Other Ambulatory Visit: Payer: Self-pay

## 2021-12-12 LAB — CBG MONITORING, ED
Glucose-Capillary: 389 mg/dL — ABNORMAL HIGH (ref 70–99)
Glucose-Capillary: 300 mg/dL — ABNORMAL HIGH (ref 70–99)

## 2021-12-12 MED ORDER — SODIUM CHLORIDE 0.9 % IV BOLUS
1000.0000 mL | Freq: Once | INTRAVENOUS | Status: AC
  Administered 2021-12-12: 1000 mL via INTRAVENOUS

## 2021-12-12 MED ORDER — INSULIN ASPART 100 UNIT/ML IJ SOLN
10.0000 [IU] | Freq: Once | INTRAMUSCULAR | Status: AC
  Administered 2021-12-12: 10 [IU] via INTRAVENOUS

## 2021-12-12 NOTE — ED Notes (Signed)
Pt was given [place] bag and ice water. It was approved through dr Jeraldine Loots

## 2021-12-12 NOTE — ED Notes (Signed)
Pt denies N/V, dizziness w/HA. Pt states he can't see anything, everything is blurred. He can only see colors. Pt states when he coughs his head "bangs."

## 2021-12-12 NOTE — ED Provider Notes (Signed)
[facility] EMERGENCY DEPARTMENT Provider Note   CSN: [id number] Arrival date & time: 12/11/21  1604     History  Chief Complaint  Patient presents with   Blurred Vision    Danny Macdonald is a 51 y.o. male.  HPI Presents after 2 episodes of visual disturbance.  He is currently asymptomatic.  He acknowledges multiple medical issues, notably insulin-dependent diabetes.  He states that he has been compliant with his medication.  There is no pain in either episode, nor any current discomfort.  Similarly patient has no weakness, confusion, disorientation, nausea, vomiting, fever, chills. Visual disturbance described as bilateral loss of vision, with resolution without intervention.    Home Medications Prior to Admission medications   Medication Sig Start Date End Date Taking? Authorizing Provider  amLODipine (NORVASC) 10 MG tablet Take 1 tablet (10 mg total) by mouth at bedtime. 10/01/19   Charlott Rakes, MD  aspirin EC 81 MG tablet Take 1 tablet by mouth daily. 11/11/18   [provider]  atorvastatin (LIPITOR) 20 MG tablet Take 1 tablet (20 mg total) by mouth daily. Patient not taking: Reported on 09/11/2021 10/01/19   Charlott Rakes, MD  atorvastatin (LIPITOR) 40 MG tablet Take 40 mg by mouth daily. 11/10/20   [provider]  buPROPion (WELLBUTRIN SR) 200 MG 12 hr tablet Take 1 tablet (200 mg total) by mouth 2 (two) times daily. 10/01/19   Charlott Rakes, MD  canagliflozin (INVOKANA) 100 MG TABS tablet Take 1 tablet (100 mg total) by mouth daily. Patient not taking: Reported on 09/11/2021 10/01/19   Charlott Rakes, MD  fluticasone (FLONASE) 50 MCG/ACT nasal spray Place 1 spray into both nostrils daily.    [provider]  glipiZIDE (GLUCOTROL) 5 MG tablet Take 1 tablet (5 mg total) by mouth daily. WITH MEAL 10/01/19   Charlott Rakes, MD  Insulin Detemir (LEVEMIR FLEXTOUCH) 100 UNIT/ML Pen Inject 50 Units into the skin 2 (two) times  daily. Hold dose for BS <150. Patient taking differently: Inject 35 Units into the skin at bedtime. 10/01/19   Charlott Rakes, MD  insulin lispro (HUMALOG) 100 UNIT/ML KwikPen Inject 9-25 Units into the skin in the morning, at noon, and at bedtime. Per sliding scale 10/24/20   [provider]  levothyroxine (SYNTHROID) 112 MCG tablet Take 1 tablet (112 mcg total) by mouth every morning. 10/02/19   Charlott Rakes, MD  losartan-hydrochlorothiazide (HYZAAR) 100-12.5 MG tablet Take 1 tablet by mouth daily. 10/01/19   Charlott Rakes, MD  metFORMIN (GLUCOPHAGE) 1000 MG tablet Take 1 tablet (1,000 mg total) by mouth 2 (two) times daily with a meal. 10/01/19   Charlott Rakes, MD  metoprolol succinate (TOPROL-XL) 25 MG 24 hr tablet Take 1 tablet (25 mg total) by mouth daily. 10/01/19   Charlott Rakes, MD  Semaglutide, 1 MG/DOSE, (OZEMPIC, 1 MG/DOSE,) 4 MG/3ML SOPN Inject 1 mg into the skin once a week.    [provider]  sildenafil (VIAGRA) 100 MG tablet Take 1 tablet (100 mg total) by mouth as needed for erectile dysfunction. 04/29/19   Scot Jun, FNP      Allergies    Metformin    Review of Systems   Review of Systems  Constitutional:        Per HPI, otherwise negative  HENT:         Per HPI, otherwise negative  Eyes:  Positive for visual disturbance.  Respiratory:         Per HPI, otherwise negative  Cardiovascular:        Per HPI, otherwise negative  Gastrointestinal:  Negative for vomiting.  Endocrine:       Negative aside from HPI  Genitourinary:        Neg aside from HPI   Musculoskeletal:        Per HPI, otherwise negative  Skin: Negative.   Neurological:  Negative for syncope.   Physical Exam Updated Vital Signs BP (!) 125/96    Pulse 88    Temp 98.3 F (36.8 C)    Resp (!) 23    SpO2 94%  Physical Exam Vitals and nursing note reviewed.  Constitutional:      General: He is not in acute distress.    Appearance: He is well-developed.  HENT:      Head: Normocephalic and atraumatic.  Eyes:     Conjunctiva/sclera: Conjunctivae normal.  Cardiovascular:     Rate and Rhythm: Normal rate and regular rhythm.  Pulmonary:     Effort: Pulmonary effort is normal. No respiratory distress.     Breath sounds: No stridor.  Abdominal:     General: There is no distension.  Skin:    General: Skin is warm and dry.  Neurological:     Mental Status: He is alert and oriented to person, place, and time.    ED Results / Procedures / Treatments   Labs (all labs ordered are listed, but only abnormal results are displayed) Labs Reviewed  COMPREHENSIVE METABOLIC PANEL - Abnormal; Notable for the following components:      Result Value   Chloride 96 (*)    Glucose, Bld 521 (*)    Creatinine, Ser 1.51 (*)    Alkaline Phosphatase 134 (*)    GFR, Estimated 56 (*)    All other components within normal limits  OSMOLALITY - Abnormal; Notable for the following components:   Osmolality 316 (*)    All other components within normal limits  CBG MONITORING, ED - Abnormal; Notable for the following components:   Glucose-Capillary 508 (*)    All other components within normal limits  CBG MONITORING, ED - Abnormal; Notable for the following components:   Glucose-Capillary 389 (*)    All other components within normal limits  CBC WITH DIFFERENTIAL/PLATELET  ETHANOL  RAPID URINE DRUG SCREEN, HOSP PERFORMED    EKG None  Radiology CT HEAD WO CONTRAST (5MM)  Result Date: 12/11/2021 CLINICAL DATA:  Diplopia EXAM: CT HEAD WITHOUT CONTRAST TECHNIQUE: Contiguous axial images were obtained from the base of the skull through the vertex without intravenous contrast. COMPARISON:  None. BRAIN: BRAIN Patchy and confluent areas of decreased attenuation are noted throughout the deep and periventricular white matter of the cerebral hemispheres bilaterally, compatible with chronic microvascular ischemic disease. No evidence of large-territorial acute infarction. No  parenchymal hemorrhage. No mass lesion. No extra-axial collection. No mass effect or midline shift. No hydrocephalus. Basilar cisterns are patent. Vascular: No hyperdense vessel. Skull: No acute fracture or focal lesion. Sinuses/Orbits: Mucosal thickening of the paranasal sinuses. The mastoid air cells are clear. The orbits are unremarkable. Other: None. IMPRESSION: 1. No acute intracranial abnormality. 2. Pansinusitis. Correlate with signs and symptoms of acute sinusitis. Electronically Signed   By: Iven Finn M.D.   On: 12/11/2021 18:12    Procedures Procedures    Medications Ordered in ED Medications  sodium chloride 0.9 % bolus 1,000 mL (1,000 mLs Intravenous New Bag/Given 12/12/21 0935)  insulin aspart (novoLOG) injection 10 Units (10 Units Intravenous Given 12/12/21  [email or web address])    ED Course/ Medical Decision Making/ A&P Clinical Course as of 12/12/21 0946  Mon Dec 11, 2021  1833 Contacted by lab with reports that glucose is 521 [PB]    Clinical Course User Index [PB] Loni Beckwith, PA-C  With concern for hyperglycemia, visual disturbance patient received fluids, insulin. 11:03 AM On repeat exam patient sleeping, awakens easily.  Glucose now 300.                          Medical Decision Making  Adult male presents after 2 episodes of brief visual disturbance.  Here patient is awake and alert, has no complaints, nor neurologic findings on exam.  Some suspicion for the patient's diabetes, hyperglycemia contributing to today's episode.  No early evidence for nonketotic hyperosmolar coma, nor DKA.  Patient's glucose however is substantially elevated, requiring IV fluids, glucose for control.  This improved and substantial improvement numerically, he had no complaints throughout the remainder of his ED course.  I interpreted his MRI which was negative for stroke or other acute phenomena.  Labs, also reviewed by myself, and consistent with concurrent infection, no substantial  electrolyte abnormalities, beyond his hyperglycemia.  Reviewed his chart, notable for recent conversation with his diabetes coordinator in which there was some question about his taking all medication as directed, possibly secondary to side effects.  Patient appropriate for discharge now with improved values here, to follow-up closely with his diabetes care team.        Final Clinical Impression(s) / ED Diagnoses Final diagnoses:  Visual disturbance  Hyperglycemia     Carmin Muskrat, MD 12/12/21 1105

## 2021-12-12 NOTE — Discharge Instructions (Signed)
As discussed, your evaluation today has been largely reassuring.  But, it is important that you monitor your condition carefully, and do not hesitate to return to the ED if you develop new, or concerning changes in your condition.  Otherwise, please follow-up with your physician for appropriate ongoing care.  It is very important that you discuss your hyperglycemia and your diabetes medication regimen with your physician.

## 2023-08-09 IMAGING — CT CT HEAD W/O CM
4 series · 17 of 47 positions shown, 19 images · non-contrast
Comparison: None.

CLINICAL DATA: Diplopia

EXAM:
CT HEAD WITHOUT CONTRAST
TECHNIQUE: Contiguous axial images were obtained from the base of the skull
through the vertex without intravenous contrast.

[Series 3: head without · axial · non-contrast · 0.42mm/px · z∈[+1166,+1286]mm · 7 of 34 slices shown, 9 images]
[im 5/34  brain]
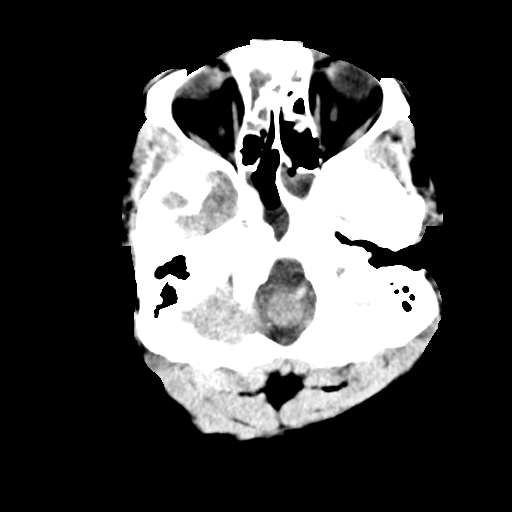
[im 5/34  bone]
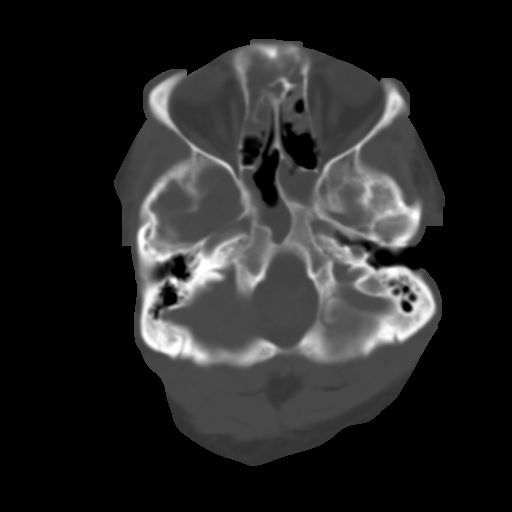
[im 9/34  brain]
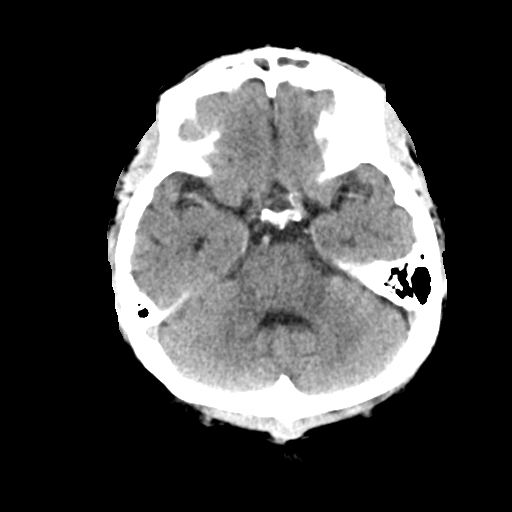
[im 13/34  brain]
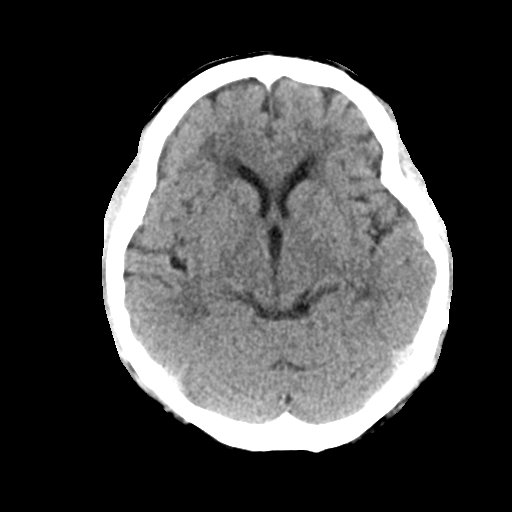
[im 17/34  brain]
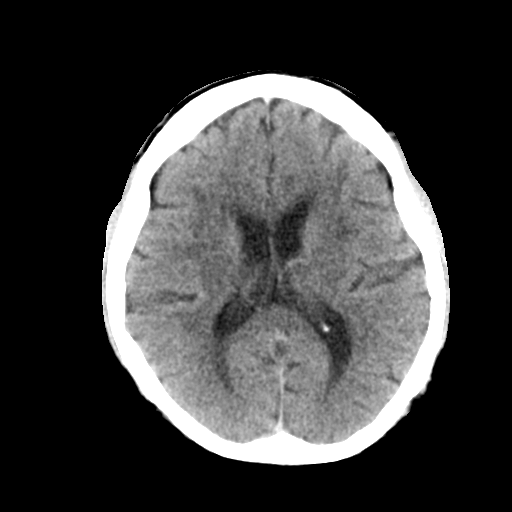
[im 21/34  brain]
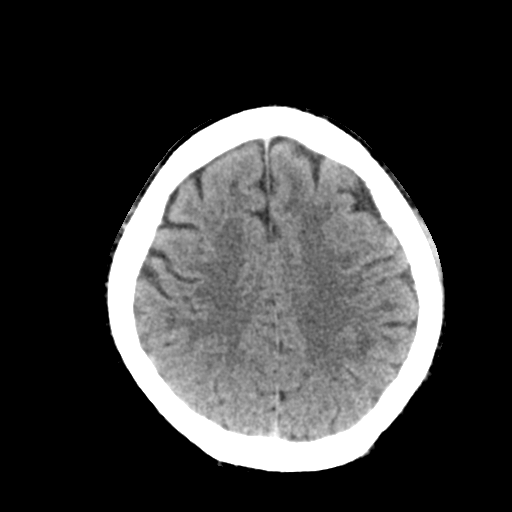
[im 21/34  bone]
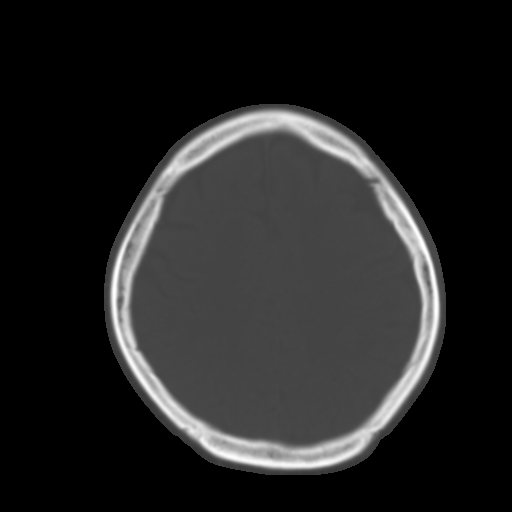
[im 25/34  brain]
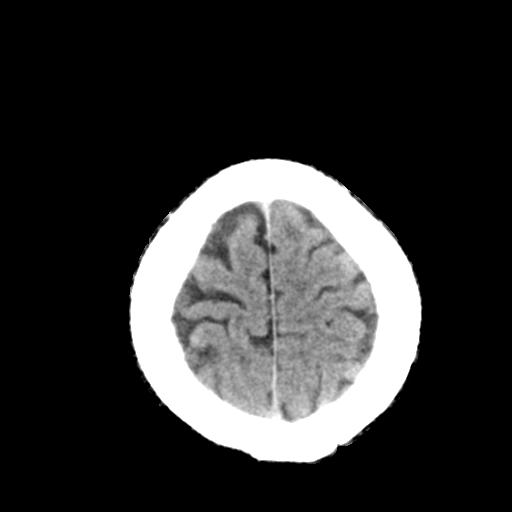
[im 29/34  brain]
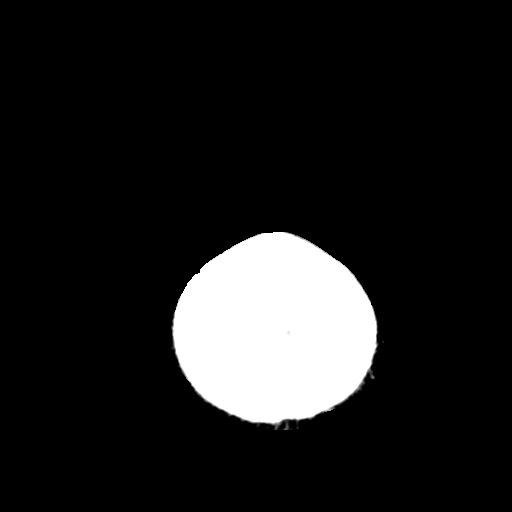

[Series 4: head bone · axial · 0.42mm/px · z∈[+1162,+1218]mm · 4 of 82 slices shown]
[im 9/82  bone]
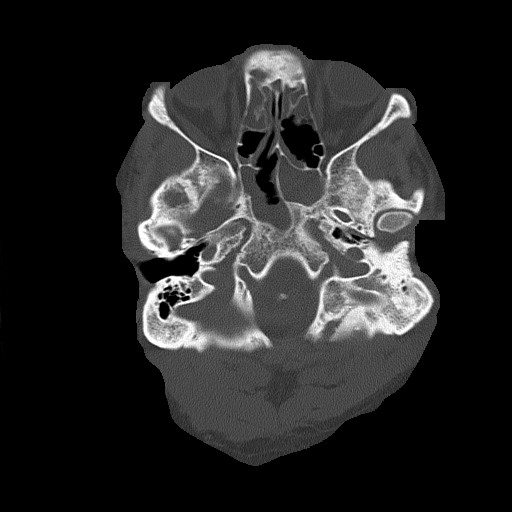
[im 17/82  bone]
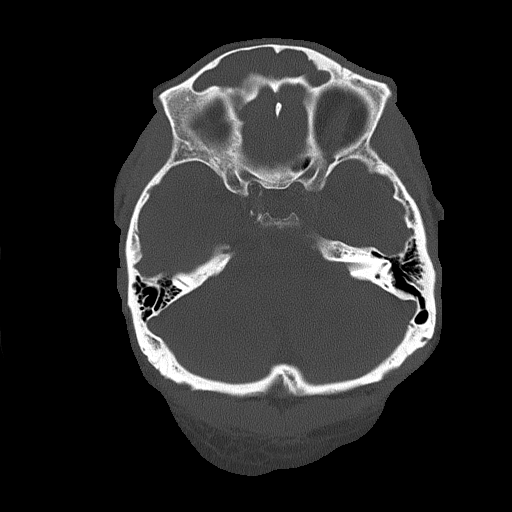
[im 25/82  bone]
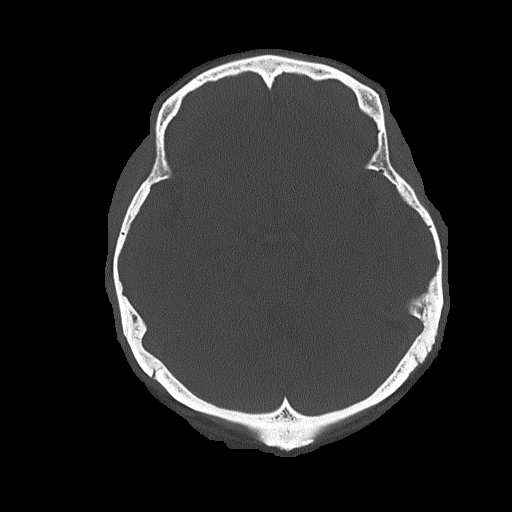
[im 37/82  bone]
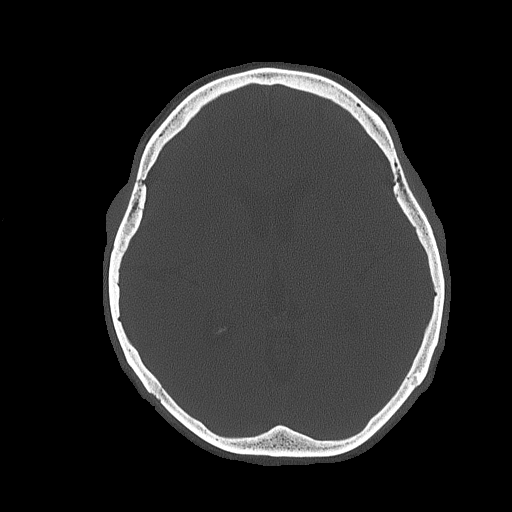

[Series 5: head without cor · coronal · non-contrast · 0.32mm/px · 3 of 67 slices shown]
[im 25/67  brain]
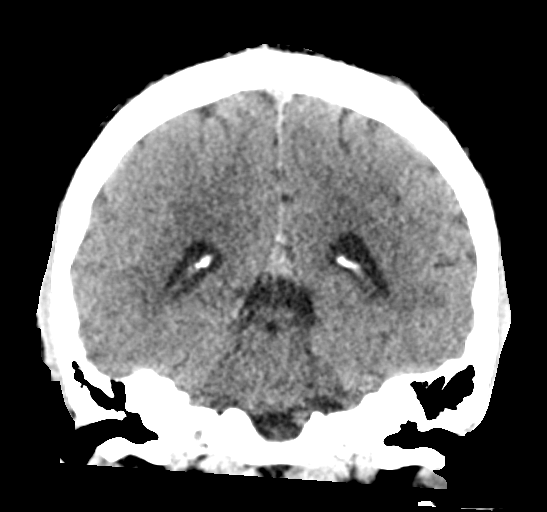
[im 31/67  brain]
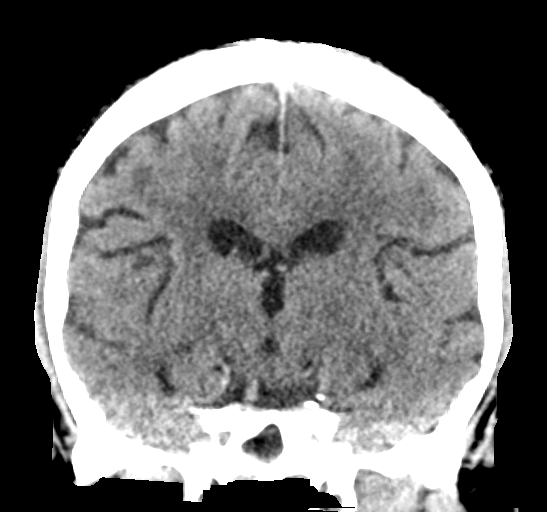
[im 37/67  brain]
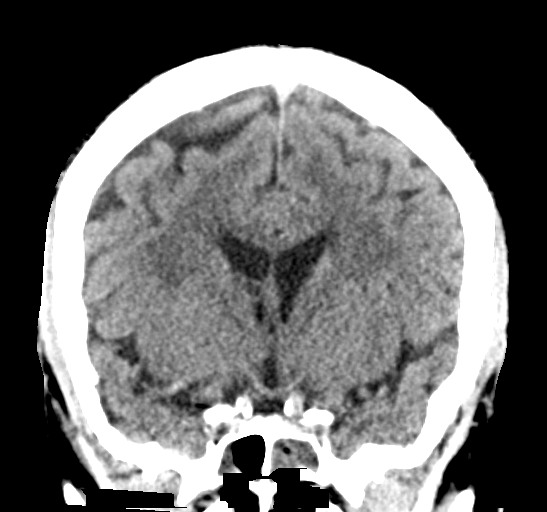

[Series 6: head without sag · sagittal · non-contrast · 0.33mm/px · 3 of 67 slices shown]
[im 23/67  brain]
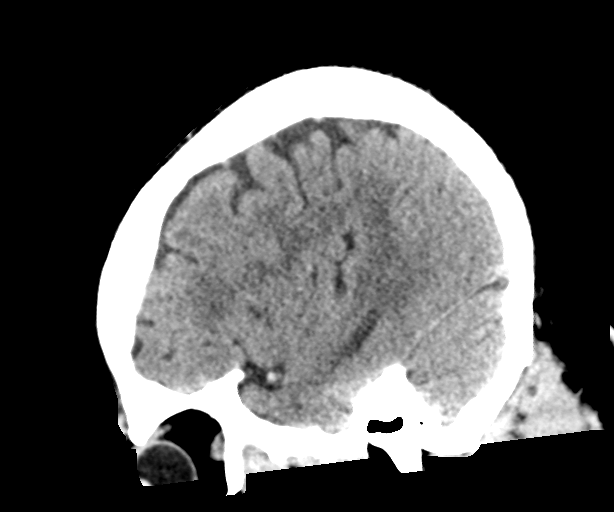
[im 34/67  brain]
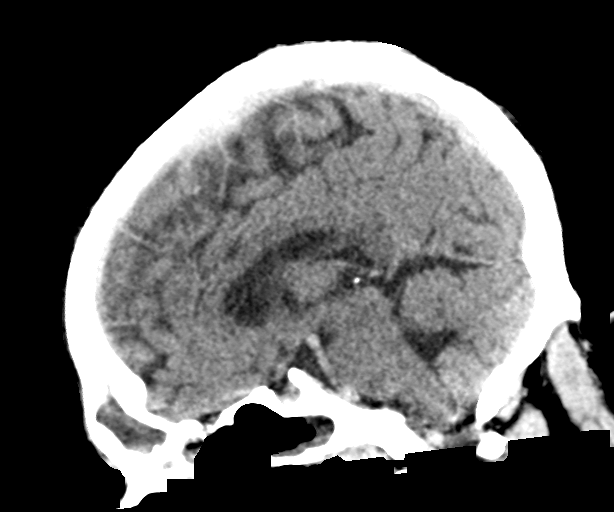
[im 45/67  brain]
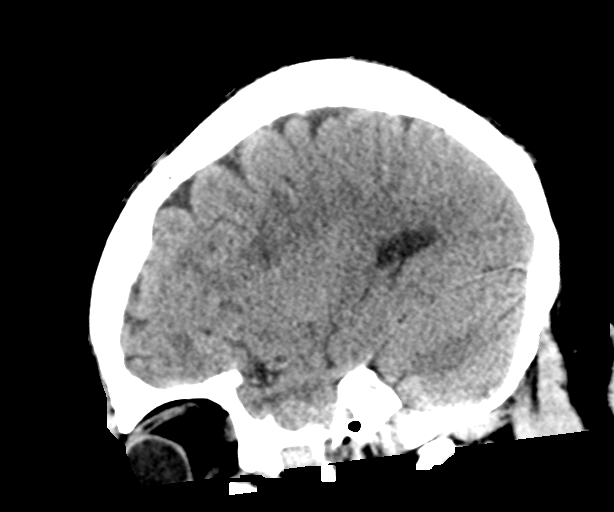

[17 of 47 positions shown; findings below may reference images not displayed]

BRAIN:
BRAIN
Patchy and confluent areas of decreased attenuation are noted
throughout the deep and periventricular white matter of the cerebral
hemispheres bilaterally, compatible with chronic microvascular
ischemic disease.

No evidence of large-territorial acute infarction. No parenchymal
hemorrhage. No mass lesion. No extra-axial collection.

No mass effect or midline shift. No hydrocephalus. Basilar cisterns
are patent.

Vascular: No hyperdense vessel.

Skull: No acute fracture or focal lesion.

Sinuses/Orbits: Mucosal thickening of the paranasal sinuses. The
mastoid air cells are clear. The orbits are unremarkable.

Other: None.
IMPRESSION: 1. No acute intracranial abnormality.
2. Pansinusitis. Correlate with signs and symptoms of acute
sinusitis.
